# Patient Record
Sex: Male | Born: 1950 | Race: White | Hispanic: No | Marital: Married | Smoking: Former smoker
Health system: Southern US, Community
[De-identification: ages and names within clinical notes are randomized; demographics above are authoritative.]

## PROBLEM LIST (undated history)

## (undated) DIAGNOSIS — E669 Obesity, unspecified: Secondary | ICD-10-CM

## (undated) DIAGNOSIS — I5020 Unspecified systolic (congestive) heart failure: Secondary | ICD-10-CM

## (undated) DIAGNOSIS — E785 Hyperlipidemia, unspecified: Secondary | ICD-10-CM

## (undated) DIAGNOSIS — I2510 Atherosclerotic heart disease of native coronary artery without angina pectoris: Secondary | ICD-10-CM

## (undated) DIAGNOSIS — I4729 Other ventricular tachycardia: Secondary | ICD-10-CM

## (undated) DIAGNOSIS — Z95810 Presence of automatic (implantable) cardiac defibrillator: Secondary | ICD-10-CM

## (undated) DIAGNOSIS — E1169 Type 2 diabetes mellitus with other specified complication: Secondary | ICD-10-CM

## (undated) DIAGNOSIS — I4891 Unspecified atrial fibrillation: Secondary | ICD-10-CM

## (undated) DIAGNOSIS — I472 Ventricular tachycardia: Secondary | ICD-10-CM

## (undated) HISTORY — PX: HERNIA REPAIR: SHX51

---

## 2002-08-20 DIAGNOSIS — Z95810 Presence of automatic (implantable) cardiac defibrillator: Secondary | ICD-10-CM

## 2002-08-20 HISTORY — DX: Presence of automatic (implantable) cardiac defibrillator: Z95.810

## 2002-08-20 HISTORY — PX: CORONARY ARTERY BYPASS GRAFT: SHX141

## 2013-10-29 ENCOUNTER — Inpatient Hospital Stay (HOSPITAL_COMMUNITY)
Admission: EM | Admit: 2013-10-29 | Discharge: 2013-11-01 | DRG: 280 | Disposition: A | Discharge status: Home / Self Care | Payer: Non-veteran care | Attending: Internal Medicine | Admitting: Internal Medicine

## 2013-10-29 ENCOUNTER — Emergency Department (HOSPITAL_COMMUNITY): Payer: Non-veteran care

## 2013-10-29 ENCOUNTER — Encounter (HOSPITAL_COMMUNITY): Payer: Self-pay | Admitting: Emergency Medicine

## 2013-10-29 DIAGNOSIS — I472 Ventricular tachycardia, unspecified: Secondary | ICD-10-CM | POA: Diagnosis present

## 2013-10-29 DIAGNOSIS — E669 Obesity, unspecified: Secondary | ICD-10-CM | POA: Diagnosis present

## 2013-10-29 DIAGNOSIS — Z95810 Presence of automatic (implantable) cardiac defibrillator: Secondary | ICD-10-CM | POA: Diagnosis present

## 2013-10-29 DIAGNOSIS — I509 Heart failure, unspecified: Secondary | ICD-10-CM

## 2013-10-29 DIAGNOSIS — Z951 Presence of aortocoronary bypass graft: Secondary | ICD-10-CM

## 2013-10-29 DIAGNOSIS — D72829 Elevated white blood cell count, unspecified: Secondary | ICD-10-CM | POA: Diagnosis present

## 2013-10-29 DIAGNOSIS — E119 Type 2 diabetes mellitus without complications: Secondary | ICD-10-CM | POA: Diagnosis present

## 2013-10-29 DIAGNOSIS — I219 Acute myocardial infarction, unspecified: Secondary | ICD-10-CM | POA: Diagnosis present

## 2013-10-29 DIAGNOSIS — Z7982 Long term (current) use of aspirin: Secondary | ICD-10-CM

## 2013-10-29 DIAGNOSIS — I5023 Acute on chronic systolic (congestive) heart failure: Secondary | ICD-10-CM

## 2013-10-29 DIAGNOSIS — I2589 Other forms of chronic ischemic heart disease: Secondary | ICD-10-CM | POA: Diagnosis present

## 2013-10-29 DIAGNOSIS — E785 Hyperlipidemia, unspecified: Secondary | ICD-10-CM | POA: Diagnosis present

## 2013-10-29 DIAGNOSIS — I4891 Unspecified atrial fibrillation: Secondary | ICD-10-CM | POA: Diagnosis present

## 2013-10-29 DIAGNOSIS — Z4502 Encounter for adjustment and management of automatic implantable cardiac defibrillator: Secondary | ICD-10-CM

## 2013-10-29 DIAGNOSIS — I161 Hypertensive emergency: Secondary | ICD-10-CM

## 2013-10-29 DIAGNOSIS — J9600 Acute respiratory failure, unspecified whether with hypoxia or hypercapnia: Secondary | ICD-10-CM | POA: Diagnosis present

## 2013-10-29 DIAGNOSIS — I10 Essential (primary) hypertension: Secondary | ICD-10-CM | POA: Diagnosis present

## 2013-10-29 DIAGNOSIS — I214 Non-ST elevation (NSTEMI) myocardial infarction: Secondary | ICD-10-CM | POA: Diagnosis not present

## 2013-10-29 DIAGNOSIS — I255 Ischemic cardiomyopathy: Secondary | ICD-10-CM | POA: Diagnosis present

## 2013-10-29 DIAGNOSIS — J810 Acute pulmonary edema: Secondary | ICD-10-CM | POA: Diagnosis present

## 2013-10-29 DIAGNOSIS — I2510 Atherosclerotic heart disease of native coronary artery without angina pectoris: Secondary | ICD-10-CM | POA: Diagnosis present

## 2013-10-29 DIAGNOSIS — J9690 Respiratory failure, unspecified, unspecified whether with hypoxia or hypercapnia: Secondary | ICD-10-CM | POA: Diagnosis present

## 2013-10-29 DIAGNOSIS — Z7901 Long term (current) use of anticoagulants: Secondary | ICD-10-CM

## 2013-10-29 DIAGNOSIS — I480 Paroxysmal atrial fibrillation: Secondary | ICD-10-CM | POA: Diagnosis present

## 2013-10-29 HISTORY — DX: Other ventricular tachycardia: I47.29

## 2013-10-29 HISTORY — DX: Atherosclerotic heart disease of native coronary artery without angina pectoris: I25.10

## 2013-10-29 HISTORY — DX: Type 2 diabetes mellitus with other specified complication: E66.9

## 2013-10-29 HISTORY — DX: Hyperlipidemia, unspecified: E78.5

## 2013-10-29 HISTORY — DX: Presence of automatic (implantable) cardiac defibrillator: Z95.810

## 2013-10-29 HISTORY — DX: Type 2 diabetes mellitus with other specified complication: E11.69

## 2013-10-29 HISTORY — DX: Unspecified atrial fibrillation: I48.91

## 2013-10-29 HISTORY — DX: Ventricular tachycardia: I47.2

## 2013-10-29 HISTORY — DX: Unspecified systolic (congestive) heart failure: I50.20

## 2013-10-29 LAB — BASIC METABOLIC PANEL
BUN: 19 mg/dL (ref 6–23)
CHLORIDE: 99 meq/L (ref 96–112)
CO2: 22 mEq/L (ref 19–32)
Calcium: 9.2 mg/dL (ref 8.4–10.5)
Creatinine, Ser: 1.11 mg/dL (ref 0.50–1.35)
GFR calc Af Amer: 80 mL/min — ABNORMAL LOW (ref >90)
GFR calc non Af Amer: 69 mL/min — ABNORMAL LOW (ref >90)
GLUCOSE: 243 mg/dL — AB (ref 70–99)
Potassium: 4.3 mEq/L (ref 3.7–5.3)
Sodium: 140 mEq/L (ref 137–147)

## 2013-10-29 LAB — PROTIME-INR
INR: 2.85 — ABNORMAL HIGH (ref 0.00–1.49)
PROTHROMBIN TIME: 28.9 s — AB (ref 11.6–15.2)

## 2013-10-29 LAB — DIFFERENTIAL
BASOS ABS: 0 10*3/uL (ref 0.0–0.1)
Basophils Relative: 0 % (ref 0–1)
Eosinophils Absolute: 0.5 10*3/uL (ref 0.0–0.7)
Eosinophils Relative: 3 % (ref 0–5)
Lymphocytes Relative: 46 % (ref 12–46)
Lymphs Abs: 7.3 10*3/uL — ABNORMAL HIGH (ref 0.7–4.0)
MONOS PCT: 5 % (ref 3–12)
Monocytes Absolute: 0.8 10*3/uL (ref 0.1–1.0)
NEUTROS PCT: 46 % (ref 43–77)
Neutro Abs: 7.3 10*3/uL (ref 1.7–7.7)

## 2013-10-29 LAB — APTT: aPTT: 35 seconds (ref 24–37)

## 2013-10-29 LAB — CBC
HCT: 44.7 % (ref 39.0–52.0)
Hemoglobin: 15.8 g/dL (ref 13.0–17.0)
MCH: 35.1 pg — ABNORMAL HIGH (ref 26.0–34.0)
MCHC: 35.3 g/dL (ref 30.0–36.0)
MCV: 99.3 fL (ref 78.0–100.0)
PLATELETS: 220 10*3/uL (ref 150–400)
RBC: 4.5 MIL/uL (ref 4.22–5.81)
RDW: 15 % (ref 11.5–15.5)
WBC: 15.9 10*3/uL — AB (ref 4.0–10.5)

## 2013-10-29 LAB — PRO B NATRIURETIC PEPTIDE: Pro B Natriuretic peptide (BNP): 1167 pg/mL — ABNORMAL HIGH (ref 0–125)

## 2013-10-29 LAB — I-STAT TROPONIN, ED: Troponin i, poc: 0.3 ng/mL (ref 0.00–0.08)

## 2013-10-29 MED ORDER — ASPIRIN 81 MG PO CHEW: 324.0000 mg | CHEWABLE_TABLET | Freq: Once | ORAL | Status: DC

## 2013-10-29 MED ORDER — ASPIRIN 81 MG PO CHEW
CHEWABLE_TABLET | ORAL | Status: AC
  Filled 2013-10-29: qty 1

## 2013-10-29 MED ORDER — INSULIN ASPART 100 UNIT/ML ~~LOC~~ SOLN
0.0000 [IU] | Freq: Every day | SUBCUTANEOUS | Status: DC
Start: 2013-10-29 — End: 2013-11-01

## 2013-10-29 MED ORDER — METOPROLOL SUCCINATE ER 50 MG PO TB24
50.0000 mg | ORAL_TABLET | Freq: Two times a day (BID) | ORAL | Status: DC
  Filled 2013-10-29 (×3): qty 1

## 2013-10-29 MED ORDER — INSULIN ASPART 100 UNIT/ML ~~LOC~~ SOLN
0.0000 [IU] | Freq: Three times a day (TID) | SUBCUTANEOUS | Status: DC
  Administered 2013-10-30: 5 [IU] via SUBCUTANEOUS
  Administered 2013-10-30 – 2013-10-31 (×3): 2 [IU] via SUBCUTANEOUS
  Administered 2013-10-31: 3 [IU] via SUBCUTANEOUS
  Administered 2013-11-01: 2 [IU] via SUBCUTANEOUS

## 2013-10-29 MED ORDER — SODIUM CHLORIDE 0.9 % IJ SOLN
3.0000 mL | INTRAMUSCULAR | Status: DC | PRN
  Administered 2013-10-30: 3 mL via INTRAVENOUS

## 2013-10-29 MED ORDER — MORPHINE SULFATE 2 MG/ML IJ SOLN
INTRAMUSCULAR | Status: AC
  Administered 2013-10-29: 4 mg via INTRAVENOUS
  Filled 2013-10-29: qty 1

## 2013-10-29 MED ORDER — SODIUM CHLORIDE 0.9 % IV SOLN
Freq: Once | INTRAVENOUS | Status: AC
  Administered 2013-10-29: 22:00:00 via INTRAVENOUS

## 2013-10-29 MED ORDER — ASPIRIN 300 MG RE SUPP
300.0000 mg | Freq: Once | RECTAL | Status: AC
  Administered 2013-10-29: 300 mg via RECTAL
  Filled 2013-10-29: qty 1

## 2013-10-29 MED ORDER — ATORVASTATIN CALCIUM 40 MG PO TABS
40.0000 mg | ORAL_TABLET | Freq: Every day | ORAL | Status: DC
  Administered 2013-10-30 – 2013-10-31 (×2): 40 mg via ORAL
  Filled 2013-10-29 (×3): qty 1

## 2013-10-29 MED ORDER — ASPIRIN EC 81 MG PO TBEC
81.0000 mg | DELAYED_RELEASE_TABLET | Freq: Every day | ORAL | Status: DC
  Administered 2013-10-30 – 2013-11-01 (×3): 81 mg via ORAL
  Filled 2013-10-29 (×3): qty 1

## 2013-10-29 MED ORDER — NITROGLYCERIN IN D5W 200-5 MCG/ML-% IV SOLN
INTRAVENOUS | Status: AC
  Filled 2013-10-29: qty 250

## 2013-10-29 MED ORDER — MORPHINE SULFATE 4 MG/ML IJ SOLN: 4.0000 mg | Freq: Once | INTRAMUSCULAR | Status: AC

## 2013-10-29 MED ORDER — FUROSEMIDE 10 MG/ML IJ SOLN
40.0000 mg | Freq: Once | INTRAMUSCULAR | Status: AC
  Administered 2013-10-29: 40 mg via INTRAVENOUS
  Filled 2013-10-29: qty 4

## 2013-10-29 MED ORDER — LOSARTAN POTASSIUM 25 MG PO TABS
25.0000 mg | ORAL_TABLET | Freq: Every day | ORAL | Status: DC
  Filled 2013-10-29: qty 1

## 2013-10-29 MED ORDER — MORPHINE SULFATE 2 MG/ML IJ SOLN
INTRAMUSCULAR | Status: AC
  Filled 2013-10-29: qty 1

## 2013-10-29 MED ORDER — FUROSEMIDE 10 MG/ML IJ SOLN: 40.0000 mg | Freq: Once | INTRAMUSCULAR | Status: DC

## 2013-10-29 MED ORDER — NITROGLYCERIN IN D5W 200-5 MCG/ML-% IV SOLN
2.0000 ug/min | Freq: Once | INTRAVENOUS | Status: AC
  Administered 2013-10-29: 50 ug/min via INTRAVENOUS

## 2013-10-29 MED ORDER — SPIRONOLACTONE 12.5 MG HALF TABLET
12.5000 mg | ORAL_TABLET | Freq: Every day | ORAL | Status: DC
  Filled 2013-10-29: qty 1

## 2013-10-29 MED ORDER — SODIUM CHLORIDE 0.9 % IV SOLN: 250.0000 mL | INTRAVENOUS | Status: DC | PRN

## 2013-10-29 MED ORDER — SODIUM CHLORIDE 0.9 % IJ SOLN
3.0000 mL | Freq: Two times a day (BID) | INTRAMUSCULAR | Status: DC
  Administered 2013-10-30 – 2013-11-01 (×6): 3 mL via INTRAVENOUS

## 2013-10-29 NOTE — H&P (Addendum)
Primary cardiologist:  Clarita LeberErol Lale at [facility] in [place], [place]  PCP:   No PCP Per Patient   Chief Complaint:   Defibrillator shock and shortness of breath  HPI: Is a 63 year old Caucasian male with past medical history of coronary artery disease status post CABG x5 in 2004 as well as TI with one stenting 2008, systolic congestive heart failure with LVEF of 25% per patient, status post ICD who presented with severe her hypoxic respiratory failure. Patient reported that he was watching [employer] basketball game when he suddenly felt short of breath and 30 seconds later he experienced ICD shock x1. He denied loss of consciousness. He continued to have severe shortness of breath and finally after about one hour called EMS. EMS was found him with SpO2 of 80% on room manner place him on BiPAP and called a cold STEMI because of the left bundle branch block on the electrocardiogram. On arrival to the ED patient denied chest pain however was in severe respiratory distress and severe respiratory failure requiring noninvasive positive pressure ventilation. He was given 4 mg of morphine, started on nitroglycerin drip and continued on BiPAP with significant prominence of the into shortness of breath.  Patient was discussed with Dr. Tresa EndoKelly from interventional cardiology and we decided to cancel: STEMI.  Patient reported that possible triggers for his heart failure exacerbation could be dietary indiscretion, as well as emotional stress while watching basketball.     Review of Systems:  The patient denies anorexia, fever, weight loss,, vision loss, decreased hearing, hoarseness, balance deficits, hemoptysis, abdominal pain, melena, hematochezia, severe indigestion/heartburn, hematuria, incontinence, genital sores, muscle weakness, suspicious skin lesions, transient blindness, difficulty walking, depression, unusual weight change, abnormal bleeding, enlarged lymph nodes, angioedema, and breast  masses.  Past Medical History:  Past Surgical History  Procedure Laterality Date  . Coronary artery bypass graft  2004    5 grafts ( performed at [facility] in [place])  . Hernia repair     Past Medical History  Diagnosis Date  . A-fib   . CAD (coronary artery disease)     s/ p CABG x 5, 1 stent in 2008.  Marland Kitchen. HLD (hyperlipidemia)   . Diabetes mellitus type 2 in obese   . Systolic CHF     Last LVEF 25% per patient  . ICD (implantable cardioverter-defibrillator) in place 2004    St. Jude  . Ventricular tachycardia (paroxysmal)     hx of ICD shocks x 4 per patient     Medications: Prior to Admission medications   Medication Sig Start Date End Date Taking? Authorizing Provider  aspirin 81 MG tablet Take 81 mg by mouth daily.   Yes Historical Provider, MD  losartan (COZAAR) 25 MG tablet Take 25 mg by mouth daily.   Yes Historical Provider, MD  metoprolol succinate (TOPROL-XL) 50 MG 24 hr tablet Take 50 mg by mouth 2 (two) times daily. Take with or immediately following a meal.   Yes Historical Provider, MD  niacin (NIASPAN) 1000 MG CR tablet Take 2,000 mg by mouth at bedtime.   Yes Historical Provider, MD  omega-3 acid ethyl esters (LOVAZA) 1 G capsule Take 2 g by mouth daily.   Yes Historical Provider, MD  pravastatin (PRAVACHOL) 80 MG tablet Take 80 mg by mouth daily.   Yes Historical Provider, MD  spironolactone (ALDACTONE) 25 MG tablet Take 12.5 mg by mouth daily.   Yes Historical Provider, MD  warfarin (COUMADIN) 5 MG tablet Take  5 mg by mouth daily.   Yes Historical Provider, MD   Trilipix 135 mg daily Vitamin daily   Allergies:   Allergies  Allergen Reactions  . Captopril   . Heparin     Patient reported. Unknown reaction  . Ibuprofen   . Latex    Social History:  reports that he quit smoking about 14 months ago. His smoking use included Cigarettes. He has a 20 pack-year smoking history. He does not have any smokeless tobacco history on file.  He reports that he drinks about 1.0 ounces of alcohol per week. He reports that he does not use illicit drugs. The patient is normally at baseline able to climb one flight of stairs with minimal dyspnea  Family History: Family History  Problem Relation Age of Onset  . Heart attack Father 62    PHYSICAL EXAM:  Filed Vitals:   10/29/13 2219 10/29/13 2224 10/29/13 2230 10/29/13 2245  BP: 149/117  113/74 91/72  Pulse:   102 96  Resp: 25  18 25   Height:  6\' 1"  (1.854 m)    Weight:  104.781 kg (231 lb)    SpO2: 99%  100% 99%   General:  Well appearing. No respiratory difficulty HEENT: normal Neck: supple. JVP at 14 cm. Carotids 2+ bilat; no bruits. No lymphadenopathy or thryomegaly appreciated. Cor: PMI nondisplaced. Tachcardic, Regular rate. No rubs, gallops or murmurs. Lungs: diffuse bilateral rhonchi Abdomen: soft, nontender, nondistended. No hepatosplenomegaly. No bruits or masses. Good bowel sounds. Extremities: no cyanosis, clubbing, rash, 1+ pitting edema. Neuro: alert & oriented x 3, cranial nerves grossly intact. moves all 4 extremities w/o difficulty. Affect pleasant.  Labs on Admission:   Recent Labs  10/29/13 2215  NA 140  K 4.3  CL 99  CO2 22  GLUCOSE 243*  BUN 19  CREATININE 1.11  CALCIUM 9.2   No results found for this basename: AST, ALT, ALKPHOS, BILITOT, PROT, ALBUMIN,  in the last 72 hours No results found for this basename: LIPASE, AMYLASE,  in the last 72 hours  Recent Labs  10/29/13 2215  WBC 15.9*  NEUTROABS 7.3  HGB 15.8  HCT 44.7  MCV 99.3  PLT 220   No results found for this basename: CKTOTAL, CKMB, CKMBINDEX, TROPONINI,  in the last 72 hours No results found for this basename: TSH, T4TOTAL, FREET3, T3FREE, THYROIDAB,  in the last 72 hours No results found for this basename: VITAMINB12, FOLATE, FERRITIN, TIBC, IRON, RETICCTPCT,  in the last 72 hours  Radiological Exams on Admission (all images were personally reviewed and interpreted by  me, radiology reports were reviewed as well): Dg Chest Port 1 View  10/29/2013   CLINICAL DATA:  Chest pain, shortness of breath, COPD.  EXAM: PORTABLE CHEST - 1 VIEW  COMPARISON:  None.  FINDINGS: Cardiac contour upper normal to mildly enlarged. Mild central vascular congestion. Mediastinal contours otherwise within normal range. Interstitial prominence and mild perihilar hazy airspace opacity. Mild lung base opacities. Small effusions not excluded. No pneumothorax. Left chest wall battery pack with lead tips projecting over the expected position of the right atrium and right ventricle. Multiple other wires are presumably external. Status post median sternotomy. No acute osseous finding.  IMPRESSION: Heart size upper normal to mildly enlarged. Central vascular congestion. Interstitial prominence and hazy right greater than left perihilar airspace opacity may reflect pulmonary edema or pneumonia.  Mild lung base opacities, favor atelectasis.   Electronically Signed   By: Jearld Lesch M.D.   On:  10/29/2013 23:21    ECHO: note available EKG personally reviewed and interpreted by me: Sinus Tach 140 bpm, LBBB  Assessment:  Acute respiratory failure Acute pulmonary edema Acute chronic congestive heart failure and ICD shock Leukocytosis History atrial fibrillation on warfarin diabetes mellitus type II CAD status status post CABG  Problems/plan:  Acute respiratory failure / Acute pulmonary edema /Acute chronic congestive heart failure and Precipitating factor is not entirely clear. Patient reported dietary discretion and increased level of emotional stress secondary to watching basketball. However he also got ICD shock so ventricular tachycardia or maybe atrial fibrillation paroxysm could have contributed N.p.o. except for medications Troponin x2 Echocardiogram in the morning Continue home medications: Losartan 25 mg daily, metoprolol 50 mg twice a day, spironolactone 12.5 mg daily We'll give  Lasix 40 mg IV x1 of note patient was not on home Lasix Continue with positive noninvasive pressure ventilation Nitroglycerin drip   ICD shock Patient has a [employer]. Jude's ICD. Reported appropriate ICD shocks in the past Will need to Interrogate in the morning  Coronary artery disease status post CABG No chest pain on admission, STEMI was canceled, we'll cycle troponins, continue aspirin, statin, beta, blocker  Leukocytosis Blood cultures x2, no signs of infection. Will monitor  History atrial fibrillation on warfarin We'll hold warfarin for now (case patient needs a procedure). 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

## 2013-10-29 NOTE — ED Provider Notes (Signed)
CSN: [id number]     Arrival date & time 10/29/13  2159 History   First MD Initiated Contact with Patient 10/29/13 2200     Chief Complaint  Patient presents with  . Code STEMI    HPI Emmett Sw[place]and is a 63 y.o. male with a history of CHF, MI, and COPD who presents in acute respiratory failure.  Onset of symptoms was about 2:30 PM.  His ICD discharged.  Shortly after that, he started having severe shortness of breath.  EMS reported that he had chest pain earlier today, but he denies any chest pain.  Shortness of breath is severe, worsening.  EMS was called.  On their arrival, sats 80%, respiratory distress, tachypnea, tripoding, increased WOB.  They gave 4 SL NTG with no signficant change.  EKG with strain; they were concerned for STEMI and activated code STEMI in the field.  BP was 170s systolic.  They initiated BiPAP.  He has a history of severe CHF s/p ICD pacer, MI s/p CABG, HTN, COPD.  On arrival to the ED, he is satting low 90s on BiPAP, in severe respiratory distress with accessory muscle use, speaking in single words, tripod position, diaphoretic, hypertensive to 170s systolic, HR 120s.   Past medical history pertinent for CHF with EF of 25%, CAD, MI, COPD, hypertension  History reviewed. No pertinent past surgical history. No family history on file. History  Substance Use Topics  . Smoking status: Not on file  . Smokeless tobacco: Not on file  . Alcohol Use: Not on file    Review of Systems  Unable to perform ROS: Acuity of condition  Constitutional: Positive for diaphoresis.  Respiratory: Positive for shortness of breath.       Allergies  Heparin  Home Medications  No current outpatient prescriptions on file. BP 112/71  Pulse 97  Resp 22  Ht 6\' 1"  (1.854 m)  Wt 231 lb (104.781 kg)  BMI 30.48 kg/m2  SpO2 99% Physical Exam  Nursing note and vitals reviewed. Constitutional: He is oriented to person, place, and time. He appears distressed.  HENT:  Head:  Normocephalic and atraumatic.  Eyes: Conjunctivae are normal. Pupils are equal, round, and reactive to light.  Neck: Neck supple. JVD present. No tracheal deviation present.  Cardiovascular: Intact distal pulses.  Tachycardia present.   No murmur heard. Pulmonary/Chest: Accessory muscle usage present. No stridor. Tachypnea noted. He is in respiratory distress. He has rales in the right middle field, the right lower field, the left middle field and the left lower field.  Abdominal: Soft. He exhibits no distension. There is no tenderness. There is no rebound and no guarding.  Musculoskeletal: He exhibits no edema and no tenderness.  Neurological: He is alert and oriented to person, place, and time. No cranial nerve deficit. He exhibits normal muscle tone. Coordination normal.  Skin: He is diaphoretic.  Cool, diaphoretic    ED Course  Procedures (including critical care time) Labs Review Labs Reviewed  CBC - Abnormal; Notable for the following:    WBC 15.9 (*)    MCH 35.1 (*)    All other components within normal limits  PROTIME-INR - Abnormal; Notable for the following:    Prothrombin Time 28.9 (*)    INR 2.85 (*)    All other components within normal limits  BASIC METABOLIC PANEL - Abnormal; Notable for the following:    Glucose, Bld 243 (*)    GFR calc non Af Amer 69 (*)    GFR calc  Af Amer 80 (*)    All other components within normal limits  PRO B NATRIURETIC PEPTIDE - Abnormal; Notable for the following:    Pro B Natriuretic peptide (BNP) 1167.0 (*)    All other components within normal limits  I-STAT TROPOININ, ED - Abnormal; Notable for the following:    Troponin i, poc 0.30 (*)    All other components within normal limits  DIFFERENTIAL  APTT  I-STAT CHEM 8, ED   Imaging Review No results found.   EKG Interpretation   Date/Time:  Thursday October 29 2013 22:04:14 EDT Ventricular Rate:  131 PR Interval:  90 QRS Duration: 169 QT Interval:  345 QTC Calculation:  509 R Axis:   -100 Text Interpretation:  Sinus or ectopic atrial tachycardia Multiple  premature complexes, vent  IVCD, consider atypical LBBB Confirmed by  Bebe ShaggyWICKLINE  MD, DONALD ([id number]) on 10/29/2013 10:18:03 PM      MDM   Park [place] is a 63 y.o. male with h/o MI and CHF here with flash pulmonary edema, hypoxic respiratory failure; His ICD fired earlier today, several hours ago, just before sx onset.  No chest pain.  Code STEMI called off by cardiology fellow, at bedside on arrival.  BP 170s systolic.  Tachycardia to 120.  Respiratory distress, severe.  Rales to upper lung fields.  JVD.  Remainder of exam unremarkable. sats improved to upper 90s when placed on BiPAP. Started NTG gtt. Significant improvement in BP. EKG with LBBB. Gave Morphine. Gave rectal ASA.  On BiPAP throughout ED course.  Reevaluated multiple times during ED course; on reevaluation, normotensive on NTG gtt.  Appears significantly improved with nearly normal WOB, sats 100% on BiPAP, sitting back in bed, relaxed, endorses improved dyspnea.  Gave 40 mg IV lasix.  Will be admitted to CCU for further management.   Final diagnoses:  Hypertensive emergency  Respiratory failure  Acute pulmonary edema      Toney SangJerrid Mishael Krysiak, MD 10/30/13 0830

## 2013-10-29 NOTE — ED Provider Notes (Signed)
Pt seen on arrival with resident Pt appears to be in acute pulmonary edema currently on bipap EKG shows likely LBBB D/w dr Tresa Endo, cardiology, will not activate cath labs Pt appears to be improving on bipap Will be admitted to cardiology service/ICU  Joya Gaskins, MD 10/29/13 2218

## 2013-10-29 NOTE — ED Notes (Signed)
Family walked back from lobby

## 2013-10-29 NOTE — ED Notes (Signed)
Pt's pacer went off at 230 pm. Patient began having substernal CP and SOB. Bilateral rales in all 4 lobes. RA sat 82%. Patient on CPAP at this time sats are 94%

## 2013-10-30 ENCOUNTER — Inpatient Hospital Stay (HOSPITAL_COMMUNITY): Payer: Non-veteran care

## 2013-10-30 ENCOUNTER — Encounter (HOSPITAL_COMMUNITY): Payer: Self-pay | Admitting: *Deleted

## 2013-10-30 DIAGNOSIS — Z7901 Long term (current) use of anticoagulants: Secondary | ICD-10-CM

## 2013-10-30 DIAGNOSIS — I4891 Unspecified atrial fibrillation: Secondary | ICD-10-CM

## 2013-10-30 DIAGNOSIS — E119 Type 2 diabetes mellitus without complications: Secondary | ICD-10-CM | POA: Diagnosis present

## 2013-10-30 DIAGNOSIS — J810 Acute pulmonary edema: Secondary | ICD-10-CM

## 2013-10-30 DIAGNOSIS — I059 Rheumatic mitral valve disease, unspecified: Secondary | ICD-10-CM

## 2013-10-30 DIAGNOSIS — I214 Non-ST elevation (NSTEMI) myocardial infarction: Secondary | ICD-10-CM | POA: Diagnosis not present

## 2013-10-30 DIAGNOSIS — Z95810 Presence of automatic (implantable) cardiac defibrillator: Secondary | ICD-10-CM | POA: Diagnosis present

## 2013-10-30 DIAGNOSIS — I472 Ventricular tachycardia, unspecified: Secondary | ICD-10-CM | POA: Diagnosis present

## 2013-10-30 DIAGNOSIS — I480 Paroxysmal atrial fibrillation: Secondary | ICD-10-CM | POA: Diagnosis present

## 2013-10-30 DIAGNOSIS — J9600 Acute respiratory failure, unspecified whether with hypoxia or hypercapnia: Secondary | ICD-10-CM

## 2013-10-30 DIAGNOSIS — I2589 Other forms of chronic ischemic heart disease: Secondary | ICD-10-CM

## 2013-10-30 DIAGNOSIS — I255 Ischemic cardiomyopathy: Secondary | ICD-10-CM | POA: Diagnosis present

## 2013-10-30 DIAGNOSIS — E785 Hyperlipidemia, unspecified: Secondary | ICD-10-CM | POA: Diagnosis present

## 2013-10-30 DIAGNOSIS — I2510 Atherosclerotic heart disease of native coronary artery without angina pectoris: Secondary | ICD-10-CM | POA: Diagnosis present

## 2013-10-30 LAB — BASIC METABOLIC PANEL
BUN: 21 mg/dL (ref 6–23)
CALCIUM: 8.9 mg/dL (ref 8.4–10.5)
CO2: 23 meq/L (ref 19–32)
Chloride: 104 mEq/L (ref 96–112)
Creatinine, Ser: 1.01 mg/dL (ref 0.50–1.35)
GFR calc Af Amer: 89 mL/min — ABNORMAL LOW (ref >90)
GFR calc non Af Amer: 77 mL/min — ABNORMAL LOW (ref >90)
Glucose, Bld: 139 mg/dL — ABNORMAL HIGH (ref 70–99)
POTASSIUM: 4.3 meq/L (ref 3.7–5.3)
SODIUM: 142 meq/L (ref 137–147)

## 2013-10-30 LAB — TROPONIN I
TROPONIN I: 1.13 ng/mL — AB (ref <0.30)
TROPONIN I: 1.06 ng/mL — AB (ref <0.30)

## 2013-10-30 LAB — URINALYSIS, ROUTINE W REFLEX MICROSCOPIC
BILIRUBIN URINE: NEGATIVE
Glucose, UA: NEGATIVE mg/dL
HGB URINE DIPSTICK: NEGATIVE
KETONES UR: NEGATIVE mg/dL
Leukocytes, UA: NEGATIVE
Nitrite: NEGATIVE
Protein, ur: NEGATIVE mg/dL
SPECIFIC GRAVITY, URINE: 1.022 (ref 1.005–1.030)
UROBILINOGEN UA: 1 mg/dL (ref 0.0–1.0)
pH: 6.5 (ref 5.0–8.0)

## 2013-10-30 LAB — CBC
HCT: 40.5 % (ref 39.0–52.0)
Hemoglobin: 14.2 g/dL (ref 13.0–17.0)
MCH: 34.6 pg — ABNORMAL HIGH (ref 26.0–34.0)
MCHC: 35.1 g/dL (ref 30.0–36.0)
MCV: 98.8 fL (ref 78.0–100.0)
Platelets: 196 K/uL (ref 150–400)
RBC: 4.1 MIL/uL — ABNORMAL LOW (ref 4.22–5.81)
RDW: 15.2 % (ref 11.5–15.5)
WBC: 12.5 K/uL — ABNORMAL HIGH (ref 4.0–10.5)

## 2013-10-30 LAB — GLUCOSE, CAPILLARY
GLUCOSE-CAPILLARY: 162 mg/dL — AB (ref 70–99)
Glucose-Capillary: 129 mg/dL — ABNORMAL HIGH (ref 70–99)
Glucose-Capillary: 143 mg/dL — ABNORMAL HIGH (ref 70–99)
Glucose-Capillary: 216 mg/dL — ABNORMAL HIGH (ref 70–99)
Glucose-Capillary: 118 mg/dL — ABNORMAL HIGH (ref 70–99)

## 2013-10-30 LAB — MAGNESIUM: MAGNESIUM: 1.5 mg/dL (ref 1.5–2.5)

## 2013-10-30 LAB — HEMOGLOBIN A1C
Hgb A1c MFr Bld: 6.5 % — ABNORMAL HIGH (ref <5.7)
Mean Plasma Glucose: 140 mg/dL — ABNORMAL HIGH (ref <117)

## 2013-10-30 LAB — MRSA PCR SCREENING: MRSA BY PCR: NEGATIVE

## 2013-10-30 LAB — PROTIME-INR
INR: 3.03 — AB (ref 0.00–1.49)
Prothrombin Time: 30.3 seconds — ABNORMAL HIGH (ref 11.6–15.2)

## 2013-10-30 LAB — PATHOLOGIST SMEAR REVIEW

## 2013-10-30 MED ORDER — WARFARIN SODIUM 2.5 MG PO TABS
2.5000 mg | ORAL_TABLET | Freq: Once | ORAL | Status: AC
  Administered 2013-10-30: 2.5 mg via ORAL
  Filled 2013-10-30: qty 1

## 2013-10-30 MED ORDER — WARFARIN - PHARMACIST DOSING INPATIENT: Freq: Every day | Status: DC

## 2013-10-30 MED ORDER — AMIODARONE HCL 200 MG PO TABS
200.0000 mg | ORAL_TABLET | Freq: Every day | ORAL | Status: DC
  Administered 2013-10-30 – 2013-11-01 (×3): 200 mg via ORAL
  Filled 2013-10-30 (×3): qty 1

## 2013-10-30 NOTE — Progress Notes (Signed)
Cards aware of positive troponin.

## 2013-10-30 NOTE — Progress Notes (Signed)
Pts primary cardiologists' office is closed.  I was able to speak with his primary EP Dr Maudry Diego at [facility]. Dr Maudry Diego recommends amiodarone 200mg  daily (without additional load) and follow-up with him at [facility].  I would not recommend cath at this time.  We will continue coumadain (goal INR 2-3). He can be discharged if CHF is better in the am.  He should call both his primary EP and primary cardiologist to arrange follow-up once he gets home on Monday.  Electrophysiology team to see as needed while here. Please call with questions.

## 2013-10-30 NOTE — Progress Notes (Signed)
ANTICOAGULATION CONSULT NOTE - Initial Consult  Pharmacy Consult for Warfarin Indication: atrial fibrillation  Allergies  Allergen Reactions  . Captopril   . Heparin     Patient reported. Unknown reaction. However patient reported that he was diagnosed with this after his CABG  . Ibuprofen   . Latex     Patient Measurements: Height: 6\' 1"  (185.4 cm) Weight: 230 lb 13.2 oz (104.7 kg) IBW/kg (Calculated) : 79.9  Vital Signs: Temp: 98.6 F (37 C) (03/13 1200) Temp src: Oral (03/13 1200) BP: 95/59 mmHg (03/13 1200) Pulse Rate: 89 (03/13 1200)  Labs:  Recent Labs  10/29/13 2215 10/30/13 0418 10/30/13 0610 10/30/13 0855 10/30/13 1042  HGB 15.8  --   --   --  14.2  HCT 44.7  --   --   --  40.5  PLT 220  --   --   --  196  APTT 35  --   --   --   --   LABPROT 28.9*  --   --   --  30.3*  INR 2.85*  --   --   --  3.03*  CREATININE 1.11  --  1.01  --   --   TROPONINI  --  1.13*  --  1.06*  --     Estimated Creatinine Clearance: 95.1 ml/min (by C-G formula based on Cr of 1.01).   Medical History: Past Medical History  Diagnosis Date  . A-fib   . CAD (coronary artery disease)     s/ p CABG x 5, 1 stent in 2008.  Marland Kitchen HLD (hyperlipidemia)   . Diabetes mellitus type 2 in obese   . Systolic CHF     Last LVEF 25% per patient  . ICD (implantable cardioverter-defibrillator) in place 2004    St. Jude  . Ventricular tachycardia (paroxysmal)     hx of ICD shocks x 4 per patient    Medications:  Scheduled:  . amiodarone  200 mg Oral Daily  . aspirin EC  81 mg Oral Daily  . atorvastatin  40 mg Oral q1800  . furosemide  40 mg Intravenous Once  . insulin aspart  0-15 Units Subcutaneous TID WC  . insulin aspart  0-5 Units Subcutaneous QHS  . sodium chloride  3 mL Intravenous Q12H  . warfarin  2.5 mg Oral ONCE-1800  . Warfarin - Pharmacist Dosing Inpatient   Does not apply q1800    Assessment: 63 yo male admitted on 3/12 with CC of defibrillator shock and SOB. Pharmacy  has been consulted to resume PTA warfarin for afib. INR on admit on 3/12 was 2.85, and INR this AM was 3/13. Patient reports his last dose of warfarin was on 3/12, and he has not missed any doses in the last week. Patient is being started on amiodarone which may increase the concentration of warfarin. Warfarin will be dosed at half the normal PTA dose. Hg 14.2, platelets 196. No s/s of bleeding noted.   Warfarin PTA dose: 5mg  daily  Goal of Therapy:  INR 2-3 Monitor platelets by anticoagulation protocol: Yes   Plan:  - Warfarin 2.5mg  x 1 - Monitor daily CBC, INR, and s/s bleeding  Christiane Ha A. Lenon Ahmadi, PharmD Clinical Pharmacist - Resident Pager: [phone] Pharmacy: [phone] 10/30/2013 4:33 PM

## 2013-10-30 NOTE — Progress Notes (Signed)
Utilization review completed. Tylen Leverich, RN, BSN. 

## 2013-10-30 NOTE — Consult Note (Signed)
ELECTROPHYSIOLOGY CONSULT NOTE    Patient ID: Jose Sanford MRN: [id number], DOB/AGE: [birth year] 63 y.o.  Admit date: 10/29/2013 Date of Consult: 10-30-2013  Primary Physician: No PCP Per Patient Primary Cardiologist: Clarita LeberErol Lale at [facility] in [place], [place]  Reason for Consultation: ICD shock  HPI:  Jose Sanford is a 63 y.o. male with a past medical history of coronary disease s/p CABG, ischemic cardiomyopathy s/p dual chamber ICD implant, systolic heart failure, diabetes, and atrial fibrillation (on Warfarin).   [place] [employer] [place] and was at the games yesterday when he developed palpitations with subsequent ICD shock.  He continued to have severe shortness of breath and called EMS.  On EMS arrival, he was found to have a room air sat of 80% and he was transferred to [facility] for further evaluation.  He was placed on BiPap and NTG gtt with improvement in respiratory status.   Lab work is notable for troponin of 1.13, WBC 15.9, INR 2.85.  Echo ordered and is pending.   Prior to yesterday, he denies chest pain, shortness of breath, palpitations, dizziness or syncope.   EP has been asked to evaluate for device optimization.   Past Medical History  Diagnosis Date  . A-fib   . CAD (coronary artery disease)     s/ p CABG x 5, 1 stent in 2008.  Marland Kitchen. HLD (hyperlipidemia)   . Diabetes mellitus type 2 in obese   . Systolic CHF     Last LVEF 25% per patient  . ICD (implantable cardioverter-defibrillator) in place 2004    St. Jude  . Ventricular tachycardia (paroxysmal)     hx of ICD shocks x 4 per patient     Surgical History:  Past Surgical History  Procedure Laterality Date  . Coronary artery bypass graft  2004    5 grafts ( [facility] in [place])  . Hernia repair       Prescriptions prior to admission  Medication Sig Dispense Refill  . aspirin 81 MG tablet Take 81 mg by mouth daily.      Marland Kitchen. losartan (COZAAR) 25 MG  tablet Take 25 mg by mouth daily.      . metoprolol succinate (TOPROL-XL) 50 MG 24 hr tablet Take 50 mg by mouth 2 (two) times daily. Take with or immediately following a meal.      . niacin (NIASPAN) 1000 MG CR tablet Take 2,000 mg by mouth at bedtime.      Marland Kitchen. omega-3 acid ethyl esters (LOVAZA) 1 G capsule Take 2 g by mouth daily.      . pravastatin (PRAVACHOL) 80 MG tablet Take 80 mg by mouth daily.      Marland Kitchen. spironolactone (ALDACTONE) 25 MG tablet Take 12.5 mg by mouth daily.      Marland Kitchen. warfarin (COUMADIN) 5 MG tablet Take 5 mg by mouth daily.        Inpatient Medications:  . aspirin EC  81 mg Oral Daily  . atorvastatin  40 mg Oral q1800  . furosemide  40 mg Intravenous Once  . insulin aspart  0-15 Units Subcutaneous TID WC  . insulin aspart  0-5 Units Subcutaneous QHS  . morphine      . sodium chloride  3 mL Intravenous Q12H    Allergies:  Allergies  Allergen Reactions  . Captopril   . Heparin     Patient reported. Unknown reaction. However patient reported that he was diagnosed  with this after his CABG  . Ibuprofen   . Latex     History   Social History  . Marital Status: Married    Spouse Name: N/A    Number of Children: N/A  . Years of Education: N/A   Occupational History  . retired     used to work as a Production designer, theatre/television/film in [employer]   Social History Main Topics  . Smoking status: Former Smoker -- 1.00 packs/day for 20 years    Types: Cigarettes    Quit date: 08/20/1990  . Smokeless tobacco: Not on file  . Alcohol Use: 1.0 oz/week    2 drink(s) per week  . Drug Use: No  . Sexual Activity: Not on file   Other Topics Concern  . Not on file   Social History Narrative  . No narrative on file     Family History  Problem Relation Age of Onset  . Heart attack Father 42    Physical Exam: Filed Vitals:   10/30/13 0600 10/30/13 0700 10/30/13 0735 10/30/13 0800  BP: 86/61 82/63 86/61  87/70  Pulse: 79 82 84 85  Temp:   98.4 F (36.9 C)   TempSrc:   Oral   Resp: 16 17 23 20     Height:      Weight:      SpO2: 99% 99% 99% 98%    GEN- The patient is well appearing, alert and oriented x 3 today.   Head- normocephalic, atraumatic Eyes-  Sclera clear, conjunctiva pink Ears- hearing intact Oropharynx- clear Neck- supple, + JVD Lungs- Clear to ausculation bilaterally, normal work of breathing Heart- Regular rate and rhythm, no murmurs, rubs or gallops, PMI not laterally displaced GI- soft, NT, ND, + BS Extremities- no clubbing, cyanosis, or edema MS- no significant deformity or atrophy Skin- ICD pocket is well healed Psych- euthymic mood, full affect Neuro- strength and sensation are intact  Labs:   Lab Results  Component Value Date   WBC 15.9* 10/29/2013   HGB 15.8 10/29/2013   HCT 44.7 10/29/2013   MCV 99.3 10/29/2013   PLT 220 10/29/2013    Recent Labs Lab 10/30/13 0610  NA 142  K 4.3  CL 104  CO2 23  BUN 21  CREATININE 1.01  CALCIUM 8.9  GLUCOSE 139*   Lab Results  Component Value Date   TROPONINI 1.13* 10/30/2013     Radiology/Studies: Dg Chest Port 1 View 10/29/2013   CLINICAL DATA:  Chest pain, shortness of breath, COPD.  EXAM: PORTABLE CHEST - 1 VIEW  COMPARISON:  None.  FINDINGS: Cardiac contour upper normal to mildly enlarged. Mild central vascular congestion. Mediastinal contours otherwise within normal range. Interstitial prominence and mild perihilar hazy airspace opacity. Mild lung base opacities. Small effusions not excluded. No pneumothorax. Left chest wall battery pack with lead tips projecting over the expected position of the right atrium and right ventricle. Multiple other wires are presumably external. Status post median sternotomy. No acute osseous finding.  IMPRESSION: Heart size upper normal to mildly enlarged. Central vascular congestion. Interstitial prominence and hazy right greater than left perihilar airspace opacity may reflect pulmonary edema or pneumonia.  Mild lung base opacities, favor atelectasis.   Electronically Signed    By: Jearld Lesch M.D.   On: 10/29/2013 23:21   WUJ:WJXBJ rhythm with IVCD  TELEMETRY: sinus rhythm  Device History: STJ dual chamber ICD implanted 2004 with subsequent generator change 2011  Assessment and Plan:  1. afib with RVR The patient was  admitted with symptomatic afib and RVR.  He received an ICD shock for V rates > 170 bpm.  This shock restored sinus rhythm and now he is resting comfortably.  INR is therapeutic.  Given conversion to sinus with ICD shock, our goal should be to keep his INR therapeutic if possible.  He is allergic to heparin. I will speak with his cardiologist in [facility] before making a recommendation for antiarrhythmic therapy  2. ICD shock He had ICD shock therapy delivered for afib with RVR.  This restored sinus rhythm. Device interrogated is reviewed by me in detail today.  I have reprogrammed his device to a single zone at 200 bpm to try to minimize inappropriate therapy.  3. Ischemic CM/ chronic systolic dysfunction Gentle diuresis as BP allows Elevated troponin is likely a demand event in the setting of afib with very fast ventricular rates and also complicated by ICD shock therapy. I am not certain that cath would be required presently.  It would be nice to avoid interruption of anticoagulation if possible.  Perhaps he could follow closely with his primary cardiologist.  I will discuss this with his cardiologist if I am able to reach him today.  (I have attempted to contact his office but presently cannot get an answer.)

## 2013-10-30 NOTE — Progress Notes (Signed)
Placed pt. On cpap. Pt. Is tolerating well. No issues at this time.

## 2013-10-30 NOTE — Progress Notes (Signed)
  Echocardiogram 2D Echocardiogram has been performed.  [place] 10/30/2013, 10:37 AM

## 2013-10-30 NOTE — Progress Notes (Addendum)
SUBJECTIVE:  Feeling better on BiPAP  OBJECTIVE:   Vitals:   Filed Vitals:   10/30/13 0600 10/30/13 0700 10/30/13 0735 10/30/13 0800  BP: 86/61 82/63 86/61  87/70  Pulse: 79 82 84 85  Temp:   98.4 F (36.9 C)   TempSrc:   Oral   Resp: 16 17 23 20   Height:      Weight:      SpO2: 99% 99% 99% 98%   I&O's:   Intake/Output Summary (Last 24 hours) at 10/30/13 3976 Last data filed at 10/30/13 0800  Gross per 24 hour  Intake  25.75 ml  Output   1550 ml  Net -1524.25 ml   TELEMETRY: Reviewed telemetry pt in NSR:     PHYSICAL EXAM General: Well developed, well nourished, in no acute distress Head: Eyes PERRLA, No xanthomas.   Normal cephalic and atramatic  Lungs:   Crackles at right base and rhonchi anteriorly Heart:   HRRR S1 S2 Pulses are 2+ & equal. Abdomen: Bowel sounds are positive, abdomen soft and non-tender without masses  Extremities:   No clubbing, cyanosis or edema.  DP +1 Neuro: Alert and oriented X 3. Psych:  Good affect, responds appropriately   LABS: Basic Metabolic Panel:  Recent Labs  73/41/93 2215 10/30/13 0040 10/30/13 0610  NA 140  --  142  K 4.3  --  4.3  CL 99  --  104  CO2 22  --  23  GLUCOSE 243*  --  139*  BUN 19  --  21  CREATININE 1.11  --  1.01  CALCIUM 9.2  --  8.9  MG  --  1.5  --    Liver Function Tests: No results found for this basename: AST, ALT, ALKPHOS, BILITOT, PROT, ALBUMIN,  in the last 72 hours No results found for this basename: LIPASE, AMYLASE,  in the last 72 hours CBC:  Recent Labs  10/29/13 2215  WBC 15.9*  NEUTROABS 7.3  HGB 15.8  HCT 44.7  MCV 99.3  PLT 220   Cardiac Enzymes:  Recent Labs  10/30/13 0418  TROPONINI 1.13*   Coag Panel:   Lab Results  Component Value Date   INR 2.85* 10/29/2013    RADIOLOGY: Dg Chest Port 1 View  10/29/2013   CLINICAL DATA:  Chest pain, shortness of breath, COPD.  EXAM: PORTABLE CHEST - 1 VIEW  COMPARISON:  None.  FINDINGS: Cardiac contour upper normal to mildly  enlarged. Mild central vascular congestion. Mediastinal contours otherwise within normal range. Interstitial prominence and mild perihilar hazy airspace opacity. Mild lung base opacities. Small effusions not excluded. No pneumothorax. Left chest wall battery pack with lead tips projecting over the expected position of the right atrium and right ventricle. Multiple other wires are presumably external. Status post median sternotomy. No acute osseous finding.  IMPRESSION: Heart size upper normal to mildly enlarged. Central vascular congestion. Interstitial prominence and hazy right greater than left perihilar airspace opacity may reflect pulmonary edema or pneumonia.  Mild lung base opacities, favor atelectasis.   Electronically Signed   By: Jearld Lesch M.D.   On: 10/29/2013 23:21   Assessment:  Acute respiratory failure  Acute pulmonary edema  Acute on chronic congestive heart failure  ICD shock  Leukocytosis  History atrial fibrillation on warfarin  diabetes mellitus type II  CAD status status post CABG   Problems/plan:  Acute respiratory failure / Acute pulmonary edema /Acute chronic congestive heart failure  Precipitating factor is not entirely clear. Patient  reported dietary indiscretion and increased level of emotional stress secondary to watching basketball. However he also got ICD shock so ventricular tachycardia or maybe atrial fibrillation paroxysm could have contributed.  He has put out 1.5L since discharge.  I suspect he has had increased volume overload from dietary indiscretion leading to atrial stretch and PAF resulting in ICD shock.  Also need to consider flash pulmonary edema from transient ischemia and VT with shock.  He did notice palpitations right before the ICD shock. - Echocardiogram in the morning  - Hold Losartan/spironolactone/IV NTG gtt and beta blocker for now due to hypotension - Continue with positive noninvasive pressure ventilation  - continue ASA  ICD shock -  with palpitations prior to the event Patient has a St. Jude's ICD.  Reported appropriate ICD shocks in the past  Will need to Interrogate this am NSTEMI possibly secondary to demand ischemia secondary to acute CHF but need to consider acute ischemia in the setting of ICD shock.  Could have graft failure.  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 Will monitor  - recheck CBC this am - check UA - repeat chest xray this am due to findings yesterday of interstitial prominence and hazy right greater than left  perihilar airspace opacity which may reflect pulmonary edema or pneumonia. - CCM consult for possible PNA with sepsis (BP low and elevated WBC) History atrial fibrillation on warfarin  We'll hold warfarin for now (case patient needs a procedure). Patient is adequately anticoagulated  He is an allergic to heparin -( possible HIT) - start BiValirudin once INR<2 Diabetes mellitus type 2  Hold metformin while inpatient  In scale insulin without basal coverage for now      Quintella ReichertURNER,Dravyn Severs R, MD  10/30/2013  8:52 AM

## 2013-10-30 NOTE — Progress Notes (Signed)
Placed pt on cpap. Pt. Is tolerating well at this time. Pt. Requested nasal mask which is what pt. Wears at home. No issues at this time.

## 2013-10-30 NOTE — Discharge Instructions (Signed)
Information on my medicine - Coumadin®   (Warfarin) ° °This medication education was reviewed with me or my healthcare representative as part of my discharge preparation.  The pharmacist that spoke with me during my hospital stay was:  Geraldyne Barraclough A, RPH ° °Why was Coumadin prescribed for you? °Coumadin was prescribed for you because you have a blood clot or a medical condition that can cause an increased risk of forming blood clots. Blood clots can cause serious health problems by blocking the flow of blood to the heart, lung, or brain. Coumadin can prevent harmful blood clots from forming. °As a reminder your indication for Coumadin is:   Stroke Prevention Because Of Atrial Fibrillation ° °What test will check on my response to Coumadin? °While on Coumadin (warfarin) you will need to have an INR test regularly to ensure that your dose is keeping you in the desired range. The INR (international normalized ratio) number is calculated from the result of the laboratory test called prothrombin time (PT). ° °If an INR APPOINTMENT HAS NOT ALREADY BEEN MADE FOR YOU please schedule an appointment to have this lab work done by your health care provider within 7 days. °Your INR goal is usually a number between:  2 to 3 or your provider may give you a more narrow range like 2-2.5.  Ask your health care provider during an office visit what your goal INR is. ° °What  do you need to  know  About  COUMADIN? °Take Coumadin (warfarin) exactly as prescribed by your healthcare provider about the same time each day.  DO NOT stop taking without talking to the doctor who prescribed the medication.  Stopping without other blood clot prevention medication to take the place of Coumadin may increase your risk of developing a new clot or stroke.  Get refills before you run out. ° °What do you do if you miss a dose? °If you miss a dose, take it as soon as you remember on the same day then continue your regularly scheduled regimen the next  day.  Do not take two doses of Coumadin at the same time. ° °Important Safety Information °A possible side effect of Coumadin (Warfarin) is an increased risk of bleeding. You should call your healthcare provider right away if you experience any of the following: °  Bleeding from an injury or your nose that does not stop. °  Unusual colored urine (red or dark brown) or unusual colored stools (red or black). °  Unusual bruising for unknown reasons. °  A serious fall or if you hit your head (even if there is no bleeding). ° °Some foods or medicines interact with Coumadin® (warfarin) and might alter your response to warfarin. To help avoid this: °  Eat a balanced diet, maintaining a consistent amount of Vitamin K. °  Notify your provider about major diet changes you plan to make. °  Avoid alcohol or limit your intake to 1 drink for women and 2 drinks for men per day. °(1 drink is 5 oz. wine, 12 oz. beer, or 1.5 oz. liquor.) ° °Make sure that ANY health care provider who prescribes medication for you knows that you are taking Coumadin (warfarin).  Also make sure the healthcare provider who is monitoring your Coumadin knows when you have started a new medication including herbals and non-prescription products. ° °Coumadin® (Warfarin)  Major Drug Interactions  °Increased Warfarin Effect Decreased Warfarin Effect  °Alcohol (large quantities) °Antibiotics (esp. Septra/Bactrim, Flagyl, Cipro) °Amiodarone (Cordarone) °Aspirin (  ASA) °Cimetidine (Tagamet) °Megestrol (Megace) °NSAIDs (ibuprofen, naproxen, etc.) °Piroxicam (Feldene) °Propafenone (Rythmol SR) °Propranolol (Inderal) °Isoniazid (INH) °Posaconazole (Noxafil) Barbiturates (Phenobarbital) °Carbamazepine (Tegretol) °Chlordiazepoxide (Librium) °Cholestyramine (Questran) °Griseofulvin °Oral Contraceptives °Rifampin °Sucralfate (Carafate) °Vitamin K  ° °Coumadin® (Warfarin) Major Herbal Interactions  °Increased Warfarin Effect Decreased Warfarin Effect   °Garlic °Ginseng °Ginkgo biloba Coenzyme Q10 °Green tea °St. John’s wort   ° °Coumadin® (Warfarin) FOOD Interactions  °Eat a consistent number of servings per week of foods HIGH in Vitamin K °(1 serving = ½ cup)  °Collards (cooked, or boiled & drained) °Kale (cooked, or boiled & drained) °Mustard greens (cooked, or boiled & drained) °Parsley *serving size only = ¼ cup °Spinach (cooked, or boiled & drained) °Swiss chard (cooked, or boiled & drained) °Turnip greens (cooked, or boiled & drained)  °Eat a consistent number of servings per week of foods MEDIUM-HIGH in Vitamin K °(1 serving = 1 cup)  °Asparagus (cooked, or boiled & drained) °Broccoli (cooked, boiled & drained, or raw & chopped) °Brussel sprouts (cooked, or boiled & drained) *serving size only = ½ cup °Lettuce, raw (green leaf, endive, romaine) °Spinach, raw °Turnip greens, raw & chopped  ° °These websites have more information on Coumadin (warfarin):  www.coumadin.com; °www.ahrq.gov/consumer/coumadin.htm; ° ° °

## 2013-10-31 DIAGNOSIS — Z4502 Encounter for adjustment and management of automatic implantable cardiac defibrillator: Secondary | ICD-10-CM

## 2013-10-31 DIAGNOSIS — Z0389 Encounter for observation for other suspected diseases and conditions ruled out: Secondary | ICD-10-CM

## 2013-10-31 DIAGNOSIS — I509 Heart failure, unspecified: Secondary | ICD-10-CM

## 2013-10-31 DIAGNOSIS — I480 Paroxysmal atrial fibrillation: Secondary | ICD-10-CM | POA: Diagnosis present

## 2013-10-31 DIAGNOSIS — I5023 Acute on chronic systolic (congestive) heart failure: Principal | ICD-10-CM

## 2013-10-31 LAB — CBC
HEMATOCRIT: 33.3 % — AB (ref 39.0–52.0)
HEMOGLOBIN: 11.6 g/dL — AB (ref 13.0–17.0)
MCH: 34.5 pg — AB (ref 26.0–34.0)
MCHC: 34.8 g/dL (ref 30.0–36.0)
MCV: 99.1 fL (ref 78.0–100.0)
Platelets: 157 10*3/uL (ref 150–400)
RBC: 3.36 MIL/uL — ABNORMAL LOW (ref 4.22–5.81)
RDW: 15 % (ref 11.5–15.5)
WBC: 9.1 10*3/uL (ref 4.0–10.5)

## 2013-10-31 LAB — BASIC METABOLIC PANEL
BUN: 17 mg/dL (ref 6–23)
CO2: 22 meq/L (ref 19–32)
CREATININE: 0.93 mg/dL (ref 0.50–1.35)
Calcium: 8.7 mg/dL (ref 8.4–10.5)
Chloride: 103 mEq/L (ref 96–112)
GFR calc non Af Amer: 88 mL/min — ABNORMAL LOW (ref >90)
GLUCOSE: 105 mg/dL — AB (ref 70–99)
POTASSIUM: 3.9 meq/L (ref 3.7–5.3)
Sodium: 137 mEq/L (ref 137–147)

## 2013-10-31 LAB — GLUCOSE, CAPILLARY
GLUCOSE-CAPILLARY: 107 mg/dL — AB (ref 70–99)
GLUCOSE-CAPILLARY: 192 mg/dL — AB (ref 70–99)
GLUCOSE-CAPILLARY: 146 mg/dL — AB (ref 70–99)
GLUCOSE-CAPILLARY: 114 mg/dL — AB (ref 70–99)

## 2013-10-31 LAB — PROTIME-INR
INR: 2.87 — AB (ref 0.00–1.49)
Prothrombin Time: 29.1 seconds — ABNORMAL HIGH (ref 11.6–15.2)

## 2013-10-31 MED ORDER — METFORMIN HCL 500 MG PO TABS
250.0000 mg | ORAL_TABLET | Freq: Two times a day (BID) | ORAL | Status: DC
  Administered 2013-10-31 – 2013-11-01 (×2): 250 mg via ORAL
  Filled 2013-10-31 (×4): qty 1

## 2013-10-31 MED ORDER — WARFARIN SODIUM 5 MG PO TABS
5.0000 mg | ORAL_TABLET | Freq: Once | ORAL | Status: AC
  Administered 2013-10-31: 5 mg via ORAL
  Filled 2013-10-31: qty 1

## 2013-10-31 MED ORDER — FUROSEMIDE 20 MG PO TABS
20.0000 mg | ORAL_TABLET | Freq: Every day | ORAL | Status: DC
  Administered 2013-10-31 – 2013-11-01 (×2): 20 mg via ORAL
  Filled 2013-10-31 (×2): qty 1

## 2013-10-31 MED ORDER — FUROSEMIDE 40 MG PO TABS: 40.0000 mg | ORAL_TABLET | Freq: Every day | ORAL | Status: DC

## 2013-10-31 MED ORDER — METOPROLOL SUCCINATE ER 50 MG PO TB24
50.0000 mg | ORAL_TABLET | Freq: Two times a day (BID) | ORAL | Status: DC
  Filled 2013-10-31 (×2): qty 1

## 2013-10-31 MED ORDER — LOSARTAN POTASSIUM 25 MG PO TABS
25.0000 mg | ORAL_TABLET | Freq: Every day | ORAL | Status: DC
  Filled 2013-10-31: qty 1

## 2013-10-31 MED ORDER — METOPROLOL SUCCINATE 12.5 MG HALF TABLET
12.5000 mg | ORAL_TABLET | Freq: Every day | ORAL | Status: DC
  Administered 2013-10-31 – 2013-11-01 (×2): 12.5 mg via ORAL
  Filled 2013-10-31 (×2): qty 1

## 2013-10-31 MED ORDER — SPIRONOLACTONE 12.5 MG HALF TABLET
12.5000 mg | ORAL_TABLET | Freq: Every day | ORAL | Status: DC
  Administered 2013-10-31 – 2013-11-01 (×2): 12.5 mg via ORAL
  Filled 2013-10-31 (×2): qty 1

## 2013-10-31 NOTE — Progress Notes (Signed)
ANTICOAGULATION CONSULT NOTE - Follow-Up Consult  Pharmacy Consult for Warfarin Indication: atrial fibrillation  Allergies  Allergen Reactions  . Captopril   . Heparin     Patient reported. Unknown reaction. However patient reported that he was diagnosed with this after his CABG  . Ibuprofen   . Latex     Patient Measurements: Height: 6\' 1"  (185.4 cm) Weight: 231 lb 14.8 oz (105.2 kg) IBW/kg (Calculated) : 79.9  Vital Signs: Temp: 98.7 F (37.1 C) (03/14 0347) Temp src: Oral (03/14 0347) BP: 97/60 mmHg (03/14 0500) Pulse Rate: 91 (03/14 0500)  Labs:  Recent Labs  10/29/13 2215 10/30/13 0418 10/30/13 0610 10/30/13 0855 10/30/13 1042 10/31/13 0330  HGB 15.8  --   --   --  14.2 11.6*  HCT 44.7  --   --   --  40.5 33.3*  PLT 220  --   --   --  196 157  APTT 35  --   --   --   --   --   LABPROT 28.9*  --   --   --  30.3* 29.1*  INR 2.85*  --   --   --  3.03* 2.87*  CREATININE 1.11  --  1.01  --   --  0.93  TROPONINI  --  1.13*  --  1.06*  --   --     Estimated Creatinine Clearance: 103.5 ml/min (by C-G formula based on Cr of 0.93).   Medical History: Past Medical History  Diagnosis Date  . A-fib   . CAD (coronary artery disease)     s/ p CABG x 5, 1 stent in 2008.  Marland Kitchen HLD (hyperlipidemia)   . Diabetes mellitus type 2 in obese   . Systolic CHF     Last LVEF 25% per patient  . ICD (implantable cardioverter-defibrillator) in place 2004    St. Jude  . Ventricular tachycardia (paroxysmal)     hx of ICD shocks x 4 per patient    Medications:  Scheduled:  . amiodarone  200 mg Oral Daily  . aspirin EC  81 mg Oral Daily  . atorvastatin  40 mg Oral q1800  . furosemide  40 mg Intravenous Once  . insulin aspart  0-15 Units Subcutaneous TID WC  . insulin aspart  0-5 Units Subcutaneous QHS  . sodium chloride  3 mL Intravenous Q12H  . Warfarin - Pharmacist Dosing Inpatient   Does not apply q1800    Assessment: 63 yo male admitted on 3/12 with CC of  defibrillator shock and SOB. Pharmacy has been consulted to resume PTA warfarin for afib. INR on admit on 3/12 was 2.85, and INR on 3/13 was 3.03. Patient reports his last dose of warfarin was on 3/12, and he has not missed any doses in the last week. Patient is being started on amiodarone which may increase the concentration of warfarin. Today's INR decreased to 2.87. PTA warfarin dose will be resumed, but if the patient continues on amiodarone a dose reduction will likely be required. Hg 11.6 (down from 14.2), platelets 157. No s/s of bleeding per nurse.   Warfarin PTA dose: 5mg  daily  Goal of Therapy:  INR 2-3 Monitor platelets by anticoagulation protocol: Yes   Plan:  - Warfarin 5mg  x 1 - Monitor daily CBC, INR, and s/s bleeding  Christiane Ha A. Lenon Ahmadi, PharmD Clinical Pharmacist - Resident Pager: [phone] Pharmacy: [phone] 10/31/2013 7:33 AM

## 2013-10-31 NOTE — Progress Notes (Signed)
Report was called to RN on unit 3 [place]. Pt will transfer to room 3 [address] 1 with wife at bedside.

## 2013-10-31 NOTE — Progress Notes (Addendum)
SUBJECTIVE:  No complaints  OBJECTIVE:   Vitals:   Filed Vitals:   10/31/13 0600 10/31/13 0700 10/31/13 0746 10/31/13 0800  BP: 89/59 97/61 106/68   Pulse: 89 88 95   Temp:    98.1 F (36.7 C)  TempSrc:    Oral  Resp:   20   Height:      Weight:      SpO2: 96% 96% 96%    I&O's:   Intake/Output Summary (Last 24 hours) at 10/31/13 0835 Last data filed at 10/30/13 2300  Gross per 24 hour  Intake   1020 ml  Output    600 ml  Net    420 ml   TELEMETRY: Reviewed telemetry pt in NSR:     PHYSICAL EXAM General: Well developed, well nourished, in no acute distress Head: Eyes PERRLA, No xanthomas.   Normal cephalic and atramatic  Lungs:   Clear bilaterally to auscultation and percussion. Heart:   HRRR S1 S2 Pulses are 2+ & equal. Abdomen: Bowel sounds are positive, abdomen soft and non-tender without masses Extremities:   No clubbing, cyanosis or edema.  DP +1 Neuro: Alert and oriented X 3. Psych:  Good affect, responds appropriately   LABS: Basic Metabolic Panel:  Recent Labs  16/05/9602/12/15 2215 10/30/13 0040 10/30/13 0610 10/31/13 0330  NA 140  --  142 137  K 4.3  --  4.3 3.9  CL 99  --  104 103  CO2 22  --  23 22  GLUCOSE 243*  --  139* 105*  BUN 19  --  21 17  CREATININE 1.11  --  1.01 0.93  CALCIUM 9.2  --  8.9 8.7  MG  --  1.5  --   --    Liver Function Tests: No results found for this basename: AST, ALT, ALKPHOS, BILITOT, PROT, ALBUMIN,  in the last 72 hours No results found for this basename: LIPASE, AMYLASE,  in the last 72 hours CBC:  Recent Labs  10/29/13 2215 10/30/13 1042 10/31/13 0330  WBC 15.9* 12.5* 9.1  NEUTROABS 7.3  --   --   HGB 15.8 14.2 11.6*  HCT 44.7 40.5 33.3*  MCV 99.3 98.8 99.1  PLT 220 196 157   Cardiac Enzymes:  Recent Labs  10/30/13 0418 10/30/13 0855  TROPONINI 1.13* 1.06*   BNP: No components found with this basename: POCBNP,  D-Dimer: No results found for this basename: DDIMER,  in the last 72 hours Hemoglobin  A1C:  Recent Labs  10/30/13 0040  HGBA1C 6.5*   Fasting Lipid Panel: No results found for this basename: CHOL, HDL, LDLCALC, TRIG, CHOLHDL, LDLDIRECT,  in the last 72 hours Thyroid Function Tests: No results found for this basename: TSH, T4TOTAL, FREET3, T3FREE, THYROIDAB,  in the last 72 hours Anemia Panel: No results found for this basename: VITAMINB12, FOLATE, FERRITIN, TIBC, IRON, RETICCTPCT,  in the last 72 hours Coag Panel:   Lab Results  Component Value Date   INR 2.87* 10/31/2013   INR 3.03* 10/30/2013   INR 2.85* 10/29/2013    RADIOLOGY: Dg Chest 2 View  10/30/2013   CLINICAL DATA:  CHF, weakness.  EXAM: CHEST  2 VIEW  COMPARISON:  DG CHEST 1V PORT dated 10/29/2013  FINDINGS: Trachea is midline. Heart is enlarged. Pacemaker and ICD lead tips are seen in the right atrium and right ventricle, respectively. Biapical pleural thickening. Near complete resolution of previously seen pulmonary edema. Residual peripheral septal thickening at the lung bases. Small bilateral  effusions persist.  IMPRESSION: Near-complete resolution of pulmonary edema. Tiny bilateral pleural effusions.   Electronically Signed   By: Leanna Battles M.D.   On: 10/30/2013 10:08   Dg Chest Port 1 View  10/29/2013   CLINICAL DATA:  Chest pain, shortness of breath, COPD.  EXAM: PORTABLE CHEST - 1 VIEW  COMPARISON:  None.  FINDINGS: Cardiac contour upper normal to mildly enlarged. Mild central vascular congestion. Mediastinal contours otherwise within normal range. Interstitial prominence and mild perihilar hazy airspace opacity. Mild lung base opacities. Small effusions not excluded. No pneumothorax. Left chest wall battery pack with lead tips projecting over the expected position of the right atrium and right ventricle. Multiple other wires are presumably external. Status post median sternotomy. No acute osseous finding.  IMPRESSION: Heart size upper normal to mildly enlarged. Central vascular congestion. Interstitial  prominence and hazy right greater than left perihilar airspace opacity may reflect pulmonary edema or pneumonia.  Mild lung base opacities, favor atelectasis.   Electronically Signed   By: Jearld Lesch M.D.   On: 10/29/2013 23:21   Assessment:  Acute respiratory failure  Acute pulmonary edema  Acute on chronic congestive heart failure  ICD shock  Leukocytosis  History atrial fibrillation on warfarin  diabetes mellitus type II  CAD status status post CABG  Problems/plan:  Acute respiratory failure / Acute pulmonary edema /Acute chronic congestive heart failure  Precipitating factor most likely dietary indiscretion with volume overload and acute CHF exacerbate by afib.   Patient reported dietary indiscretion and increased level of emotional stress secondary to watching basketball. 2D echo with EF 30% and ? Clot at the apex.    - add Lasix 20mg  daily.  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  - coumadin restarted yesterday Coronary artery disease status post CABG  No chest pain on admission, NSTEMI type II from CHF and ICD shock -continue aspirin, statin, beta, blocker  - no further ischemic workup needed at this time Leukocytosis - resolved.  UA normal and no infiltrate on chest xray Paroxysmal atrial fibrillation on warfarin  - warfarin restarted with therapeutic INR - Amio low dose started yesterday.  Will continue Amio 200mg  daily and followup with his EP MD Diabetes mellitus type 2  Restart Metformin In scale insulin without basal coverage for now Dyslipidemia - restart pravastatin/Niacin  Doing well.  CHF appears  resolved.  No arrhythmias.  Tolerating Amio.  He has not been ambulating yet and was mildy SOB last PM.  Will transfer out to tele bed and ambulate today and if breathing ok in am will plan d.c tomorrow.    He will need to call both his primary EP and Cardiologist to arrange followup once he gets home.  He will need to see his Cardiologist Monday for INR check since we are adding Amio which will change his warfarin needs   Quintella Reichert, MD  10/31/2013  8:35 AM

## 2013-10-31 NOTE — ED Provider Notes (Signed)
I have personally seen and examined the patient.  I have discussed the plan of care with the resident.  I have reviewed the documentation on PMH/FH/Soc. History.  I have reviewed the documentation of the resident and agree.  CRITICAL CARE Performed by: Joya Gaskins Total critical care time: 40 Critical care time was exclusive of separately billable procedures and treating other patients. Critical care was necessary to treat or prevent imminent or life-threatening deterioration. Critical care was time spent personally by me on the following activities: development of treatment plan with patient and/or surrogate as well as nursing, discussions with consultants, evaluation of patient's response to treatment, examination of patient, obtaining history from patient or surrogate, ordering and performing treatments and interventions, ordering and review of laboratory studies, ordering and review of radiographic studies, pulse oximetry and re-evaluation of patient's condition.   Pt ill appearing on arrival, but improved with bipap and NTG drip.  Admitted to cardiology service.  Pt stabilized in the ED  Joya Gaskins, MD 10/31/13 1909

## 2013-10-31 NOTE — Progress Notes (Addendum)
Patient came to floor around 0930 am, alert and oriented, no c/o pain, ST with first degree AV block. Wife at bedside. Also ambulate patient around 11:00 am, patient c/o of lung pain and shortness of breath, O2 saturation sustain in 97%/RA, HR up to 130, metoprolol given per order, patient stated he felt better after resting . Patient ambulate in hallway x2 this eveninig no c/o  Pain or shortness of breath. Will continue to monitor patient.

## 2013-11-01 LAB — BASIC METABOLIC PANEL
BUN: 16 mg/dL (ref 6–23)
CALCIUM: 9 mg/dL (ref 8.4–10.5)
CO2: 25 mEq/L (ref 19–32)
CREATININE: 0.97 mg/dL (ref 0.50–1.35)
Chloride: 98 mEq/L (ref 96–112)
GFR calc Af Amer: >90 mL/min (ref >90)
GFR calc non Af Amer: 86 mL/min — ABNORMAL LOW (ref >90)
Glucose, Bld: 112 mg/dL — ABNORMAL HIGH (ref 70–99)
Potassium: 3.5 mEq/L — ABNORMAL LOW (ref 3.7–5.3)
SODIUM: 138 meq/L (ref 137–147)

## 2013-11-01 LAB — CBC
HCT: 34.7 % — ABNORMAL LOW (ref 39.0–52.0)
Hemoglobin: 12.2 g/dL — ABNORMAL LOW (ref 13.0–17.0)
MCH: 34.5 pg — ABNORMAL HIGH (ref 26.0–34.0)
MCHC: 35.2 g/dL (ref 30.0–36.0)
MCV: 98 fL (ref 78.0–100.0)
PLATELETS: 165 10*3/uL (ref 150–400)
RBC: 3.54 MIL/uL — AB (ref 4.22–5.81)
RDW: 14.9 % (ref 11.5–15.5)
WBC: 9.3 10*3/uL (ref 4.0–10.5)

## 2013-11-01 LAB — PROTIME-INR
INR: 2.54 — AB (ref 0.00–1.49)
Prothrombin Time: 26.5 seconds — ABNORMAL HIGH (ref 11.6–15.2)

## 2013-11-01 LAB — GLUCOSE, CAPILLARY
Glucose-Capillary: 107 mg/dL — ABNORMAL HIGH (ref 70–99)
Glucose-Capillary: 131 mg/dL — ABNORMAL HIGH (ref 70–99)

## 2013-11-01 MED ORDER — AMIODARONE HCL 200 MG PO TABS: 200.0000 mg | ORAL_TABLET | Freq: Every day | ORAL | Status: AC

## 2013-11-01 MED ORDER — WARFARIN SODIUM 5 MG PO TABS
5.0000 mg | ORAL_TABLET | Freq: Once | ORAL | Status: DC
  Filled 2013-11-01: qty 1

## 2013-11-01 MED ORDER — SPIRONOLACTONE 12.5 MG HALF TABLET: 12.5000 mg | ORAL_TABLET | Freq: Every day | ORAL | Status: AC

## 2013-11-01 MED ORDER — METOPROLOL SUCCINATE 12.5 MG HALF TABLET: 12.5000 mg | ORAL_TABLET | Freq: Every day | ORAL | Status: AC

## 2013-11-01 MED ORDER — FUROSEMIDE 20 MG PO TABS: 20.0000 mg | ORAL_TABLET | Freq: Every day | ORAL | Status: DC

## 2013-11-01 NOTE — Progress Notes (Signed)
Patient discharged to home with wife.  Patient instructed to schedule follow-up with cardiologist at home in [facility] tomorrow.  IV removed prior to discharge, IV site clean, dry, and intact.  Discharge instructions, education, and medications discussed with patient and wife prior to discharge.  Patient and wife voiced understanding of instructions.  Physical prescriptions provided to patient at time of discharge, along with discharge packet.

## 2013-11-01 NOTE — Progress Notes (Signed)
DAILY PROGRESS NOTE  Subjective:   No events overnight. No further ICD shocks. Amiodarone added. He diuresed 1L negative yesterday. Feels great. Wants to go home.  Objective:  Temp:  [98.3 F (36.8 C)-100.3 F (37.9 C)] 98.4 F (36.9 C) (03/15 0125) Pulse Rate:  [88-118] 98 (03/15 0944) Resp:  [18-20] 18 (03/15 0944) BP: (101-121)/(65-81) 112/79 mmHg (03/15 0944) SpO2:  [94 %-99 %] 98 % (03/15 0944) Weight:  [224 lb 4.8 oz (101.742 kg)] 224 lb 4.8 oz (101.742 kg) (03/15 0708) Weight change: -2 lb 3.3 oz (-1 kg)  Intake/Output from previous day: 03/14 0701 - 03/15 0700 In: 720 [P.O.:720] Out: 1775 [Urine:1775]  Intake/Output from this shift: Total I/O In: 483 [P.O.:480; I.V.:3] Out: 220 [Urine:220]  Medications: Current Facility-Administered Medications  Medication Dose Route Frequency Provider Last Rate Last Dose  . 0.9 %  sodium chloride infusion  250 mL Intravenous PRN Inez Pilgrim, MD      . amiodarone (PACERONE) tablet 200 mg  200 mg Oral Daily Thompson Grayer, MD   200 mg at 11/01/13 0945  . aspirin EC tablet 81 mg  81 mg Oral Daily Inez Pilgrim, MD   81 mg at 11/01/13 0945  . atorvastatin (LIPITOR) tablet 40 mg  40 mg Oral q1800 Inez Pilgrim, MD   40 mg at 10/31/13 1751  . furosemide (LASIX) tablet 20 mg  20 mg Oral Daily Sueanne Margarita, MD   20 mg at 11/01/13 0945  . insulin aspart (novoLOG) injection 0-15 Units  0-15 Units Subcutaneous TID WC Inez Pilgrim, MD   2 Units at 10/31/13 1752  . insulin aspart (novoLOG) injection 0-5 Units  0-5 Units Subcutaneous QHS Inez Pilgrim, MD      . metFORMIN (GLUCOPHAGE) tablet 250 mg  250 mg Oral BID WC Sueanne Margarita, MD   250 mg at 11/01/13 0757  . metoprolol succinate (TOPROL-XL) 24 hr tablet 12.5 mg  12.5 mg Oral Daily Sueanne Margarita, MD   12.5 mg at 11/01/13 0944  . sodium chloride 0.9 % injection 3 mL  3 mL Intravenous Q12H Inez Pilgrim, MD   3 mL at 11/01/13 0945  . sodium chloride 0.9 % injection 3 mL   3 mL Intravenous PRN Inez Pilgrim, MD   3 mL at 10/30/13 2102  . spironolactone (ALDACTONE) tablet 12.5 mg  12.5 mg Oral Daily Sueanne Margarita, MD   12.5 mg at 11/01/13 0945  . warfarin (COUMADIN) tablet 5 mg  5 mg Oral ONCE-1800 Burna Cash, RPH      . Warfarin - Pharmacist Dosing Inpatient   Does not apply q1800 Ace Gins, [facility]        Physical Exam: General appearance: alert and no distress Lungs: clear to auscultation bilaterally Heart: regular rate and rhythm, S1, S2 normal, no murmur, click, rub or gallop Extremities: extremities normal, atraumatic, no cyanosis or edema  Lab Results: Results for orders placed during the hospital encounter of 10/29/13 (from the past 48 hour(s))  PROTIME-INR     Status: Abnormal   Collection Time    10/30/13 10:42 AM      Result Value Ref Range   Prothrombin Time 30.3 (*) 11.6 - 15.2 seconds   INR 3.03 (*) 0.00 - 1.49  CBC     Status: Abnormal   Collection Time    10/30/13 10:42 AM      Result Value Ref Range   WBC 12.5 (*) 4.0 - 10.5 K/uL   RBC  4.10 (*) 4.22 - 5.81 MIL/uL   Hemoglobin 14.2  13.0 - 17.0 g/dL   HCT 40.5  39.0 - 52.0 %   MCV 98.8  78.0 - 100.0 fL   MCH 34.6 (*) 26.0 - 34.0 pg   MCHC 35.1  30.0 - 36.0 g/dL   RDW 15.2  11.5 - 15.5 %   Platelets 196  150 - 400 K/uL  GLUCOSE, CAPILLARY     Status: Abnormal   Collection Time    10/30/13 12:04 PM      Result Value Ref Range   Glucose-Capillary 143 (*) 70 - 99 mg/dL  URINALYSIS, ROUTINE W REFLEX MICROSCOPIC     Status: None   Collection Time    10/30/13 12:13 PM      Result Value Ref Range   Color, Urine YELLOW  YELLOW   APPearance CLEAR  CLEAR   Specific Gravity, Urine 1.022  1.005 - 1.030   pH 6.5  5.0 - 8.0   Glucose, UA NEGATIVE  NEGATIVE mg/dL   Hgb urine dipstick NEGATIVE  NEGATIVE   Bilirubin Urine NEGATIVE  NEGATIVE   Ketones, ur NEGATIVE  NEGATIVE mg/dL   Protein, ur NEGATIVE  NEGATIVE mg/dL   Urobilinogen, UA 1.0  0.0 - 1.0 mg/dL   Nitrite NEGATIVE   NEGATIVE   Leukocytes, UA NEGATIVE  NEGATIVE   Comment: MICROSCOPIC NOT DONE ON URINES WITH NEGATIVE PROTEIN, BLOOD, LEUKOCYTES, NITRITE, OR GLUCOSE <1000 mg/dL.  GLUCOSE, CAPILLARY     Status: Abnormal   Collection Time    10/30/13  4:42 PM      Result Value Ref Range   Glucose-Capillary 216 (*) 70 - 99 mg/dL  GLUCOSE, CAPILLARY     Status: Abnormal   Collection Time    10/30/13  9:10 PM      Result Value Ref Range   Glucose-Capillary 118 (*) 70 - 99 mg/dL  BASIC METABOLIC PANEL     Status: Abnormal   Collection Time    10/31/13  3:30 AM      Result Value Ref Range   Sodium 137  137 - 147 mEq/L   Potassium 3.9  3.7 - 5.3 mEq/L   Chloride 103  96 - 112 mEq/L   CO2 22  19 - 32 mEq/L   Glucose, Bld 105 (*) 70 - 99 mg/dL   BUN 17  6 - 23 mg/dL   Creatinine, Ser 0.93  0.50 - 1.35 mg/dL   Calcium 8.7  8.4 - 10.5 mg/dL   GFR calc non Af Amer 88 (*) >90 mL/min   GFR calc Af Amer >90  >90 mL/min   Comment: (NOTE)     The eGFR has been calculated using the CKD EPI equation.     This calculation has not been validated in all clinical situations.     eGFR's persistently <90 mL/min signify possible Chronic Kidney     Disease.  CBC     Status: Abnormal   Collection Time    10/31/13  3:30 AM      Result Value Ref Range   WBC 9.1  4.0 - 10.5 K/uL   RBC 3.36 (*) 4.22 - 5.81 MIL/uL   Hemoglobin 11.6 (*) 13.0 - 17.0 g/dL   Comment: DELTA CHECK NOTED     REPEATED TO VERIFY   HCT 33.3 (*) 39.0 - 52.0 %   MCV 99.1  78.0 - 100.0 fL   MCH 34.5 (*) 26.0 - 34.0 pg   MCHC 34.8  30.0 -  36.0 g/dL   RDW 15.0  11.5 - 15.5 %   Platelets 157  150 - 400 K/uL  PROTIME-INR     Status: Abnormal   Collection Time    10/31/13  3:30 AM      Result Value Ref Range   Prothrombin Time 29.1 (*) 11.6 - 15.2 seconds   INR 2.87 (*) 0.00 - 1.49  GLUCOSE, CAPILLARY     Status: Abnormal   Collection Time    10/31/13  7:45 AM      Result Value Ref Range   Glucose-Capillary 107 (*) 70 - 99 mg/dL  GLUCOSE,  CAPILLARY     Status: Abnormal   Collection Time    10/31/13 10:58 AM      Result Value Ref Range   Glucose-Capillary 192 (*) 70 - 99 mg/dL   Comment 1 Notify RN    GLUCOSE, CAPILLARY     Status: Abnormal   Collection Time    10/31/13  3:55 PM      Result Value Ref Range   Glucose-Capillary 146 (*) 70 - 99 mg/dL   Comment 1 Notify RN    GLUCOSE, CAPILLARY     Status: Abnormal   Collection Time    10/31/13  9:33 PM      Result Value Ref Range   Glucose-Capillary 114 (*) 70 - 99 mg/dL   Comment 1 Notify RN     Comment 2 Documented in Chart    BASIC METABOLIC PANEL     Status: Abnormal   Collection Time    11/01/13  3:50 AM      Result Value Ref Range   Sodium 138  137 - 147 mEq/L   Potassium 3.5 (*) 3.7 - 5.3 mEq/L   Chloride 98  96 - 112 mEq/L   CO2 25  19 - 32 mEq/L   Glucose, Bld 112 (*) 70 - 99 mg/dL   BUN 16  6 - 23 mg/dL   Creatinine, Ser 0.97  0.50 - 1.35 mg/dL   Calcium 9.0  8.4 - 10.5 mg/dL   GFR calc non Af Amer 86 (*) >90 mL/min   GFR calc Af Amer >90  >90 mL/min   Comment: (NOTE)     The eGFR has been calculated using the CKD EPI equation.     This calculation has not been validated in all clinical situations.     eGFR's persistently <90 mL/min signify possible Chronic Kidney     Disease.  CBC     Status: Abnormal   Collection Time    11/01/13  3:50 AM      Result Value Ref Range   WBC 9.3  4.0 - 10.5 K/uL   RBC 3.54 (*) 4.22 - 5.81 MIL/uL   Hemoglobin 12.2 (*) 13.0 - 17.0 g/dL   HCT 34.7 (*) 39.0 - 52.0 %   MCV 98.0  78.0 - 100.0 fL   MCH 34.5 (*) 26.0 - 34.0 pg   MCHC 35.2  30.0 - 36.0 g/dL   RDW 14.9  11.5 - 15.5 %   Platelets 165  150 - 400 K/uL  PROTIME-INR     Status: Abnormal   Collection Time    11/01/13  3:50 AM      Result Value Ref Range   Prothrombin Time 26.5 (*) 11.6 - 15.2 seconds   INR 2.54 (*) 0.00 - 1.49  GLUCOSE, CAPILLARY     Status: Abnormal   Collection Time    11/01/13  6:21 AM  Result Value Ref Range    Glucose-Capillary 107 (*) 70 - 99 mg/dL   Comment 1 Notify RN     Comment 2 Documented in Chart      Imaging: No results found.  Assessment:  1. Active Problems: 2.   Respiratory failure 3.   Acute respiratory failure 4.   Acute pulmonary edema 5.   NSTEMI (non-ST elevated myocardial infarction) 6.   CAD (coronary artery disease), native coronary artery 7.   Dyslipidemia 8.   Ischemic cardiomyopathy 9.   PAF (paroxysmal atrial fibrillation) 10.   Diabetes 11.   Ventricular tachycardia 12.   Chronic anticoagulation 13.   AICD (automatic cardioverter/defibrillator) present 14.   AICD discharge 15.   Paroxysmal atrial fibrillation 16.   Plan:  1. Ok to d/c home today on current medications and doses. He will follow-up with cardiologist in [facility] on Monday. Will need short supply of lasix to go home with if possible. Please make sure his Rx's for amiodarone go to his pharmacy in [facility].  Time Spent Directly with Patient:  15 minutes  Length of Stay:  LOS: 3 days   Pixie Casino, MD, [facility] Attending Cardiologist [facility]  Citlalli Weikel C 11/01/2013, 10:38 AM

## 2013-11-01 NOTE — Progress Notes (Signed)
ANTICOAGULATION CONSULT NOTE - Follow-Up Consult  Pharmacy Consult for Warfarin Indication: atrial fibrillation  Allergies  Allergen Reactions  . Captopril   . Heparin     Patient reported. Unknown reaction. However patient reported that he was diagnosed with this after his CABG  . Ibuprofen   . Latex     Patient Measurements: Height: 6\' 1"  (185.4 cm) Weight: 224 lb 4.8 oz (101.742 kg) (scale C) IBW/kg (Calculated) : 79.9  Vital Signs: Temp: 98.4 F (36.9 C) (03/15 0125) Temp src: Oral (03/15 0125) BP: 101/71 mmHg (03/15 0125) Pulse Rate: 88 (03/15 0125)  Labs:  Recent Labs  10/29/13 2215 10/30/13 0418 10/30/13 0610 10/30/13 0855 10/30/13 1042 10/31/13 0330 11/01/13 0350  HGB 15.8  --   --   --  14.2 11.6* 12.2*  HCT 44.7  --   --   --  40.5 33.3* 34.7*  PLT 220  --   --   --  196 157 165  APTT 35  --   --   --   --   --   --   LABPROT 28.9*  --   --   --  30.3* 29.1* 26.5*  INR 2.85*  --   --   --  3.03* 2.87* 2.54*  CREATININE 1.11  --  1.01  --   --  0.93 0.97  TROPONINI  --  1.13*  --  1.06*  --   --   --     Estimated Creatinine Clearance: 97.7 ml/min (by C-G formula based on Cr of 0.97).   Medical History: Past Medical History  Diagnosis Date  . A-fib   . CAD (coronary artery disease)     s/ p CABG x 5, 1 stent in 2008.  Marland Kitchen HLD (hyperlipidemia)   . Diabetes mellitus type 2 in obese   . Systolic CHF     Last LVEF 25% per patient  . ICD (implantable cardioverter-defibrillator) in place 2004    [employer]  . Ventricular tachycardia (paroxysmal)     hx of ICD shocks x 4 per patient    Medications:  Scheduled:  . amiodarone  200 mg Oral Daily  . aspirin EC  81 mg Oral Daily  . atorvastatin  40 mg Oral q1800  . furosemide  20 mg Oral Daily  . insulin aspart  0-15 Units Subcutaneous TID WC  . insulin aspart  0-5 Units Subcutaneous QHS  . metFORMIN  250 mg Oral BID WC  . metoprolol succinate  12.5 mg Oral Daily  . sodium chloride  3 mL  Intravenous Q12H  . spironolactone  12.5 mg Oral Daily  . Warfarin - Pharmacist Dosing Inpatient   Does not apply q1800    Assessment: 63 yo male admitted on 3/12 with CC of defibrillator shock and SOB. Pharmacy has been consulted to resume PTA warfarin for afib. INR on admit on 3/12 was 2.85, and INR on 3/13 was 3.03. Today's INR 2.54. Patient started on amiodarone on 3/13, will likely need a dose reduction over next several weeks.  Hg 12.2, platelets 165. No s/s of bleeding per nurse.   Warfarin PTA dose: 5mg  daily  Goal of Therapy:  INR 2-3 Monitor platelets by anticoagulation protocol: Yes   Plan:  - Warfarin 5mg  x 1 tonight  - Monitor daily CBC, INR, and s/s bleeding  Fotini Lemus B. Artelia Laroche, PharmD Clinical Pharmacist - Resident Phone: [phone] Pager: [phone] 11/01/2013 9:17 AM

## 2013-11-01 NOTE — Discharge Summary (Signed)
Physician Discharge Summary  Patient ID: Jose Sanford MRN: [id number] DOB/AGE: August 01, 1951 63 y.o.  Admit date: 10/29/2013 Discharge date: 11/01/2013  Primary Discharge Diagnosis: 1. Acute on Chronic Respiratory Failure 2. Acute Pulmonary Edema 3. NSTEMI 4. Atrial Fibrillation  Secondary Discharge Diagnosis: 1.Coronary Artery Disease 2. Ischemic CM 3. Diabetes 4.AICD insitu  Significant Diagnostic Studies:  1.Echocardiogram 10/30/2013 Left ventricle: There is diffuse hypokinesis. But the base/mid inferolateral segments move better than other areas. The is strong suggestion of clot in the LV apex. I can not find a study for comparison. The cavity size was mildly dilated. The estimated ejection fraction was 30%. Findings consistent with left ventricular diastolic dysfunction. - Mitral valve: Mildly calcified annulus. Mild regurgitation. - Left atrium: The atrium was moderately dilated. - Right ventricle: The cavity size was mildly dilated. Systolic function was mildly to moderately reduced. - Right atrium: The atrium was mildly dilated. - Pulmonary arteries: PA peak pressure: 75mm Hg (S).   Consults:  Electrophysiology: Dr. Gertie Sanford Course:  [name] is a 63 y/o patient followed by Dr. Clarita Sanford at [facility], [place], [place], with known history of CAD, s/P CABG, ICM, s/p Dual chamber ICD implant, systolic dysfunction, and atrial fibrillation on anticoagulation with warfarin. He was visiting [facility] for [facility] tournament. He had sudden onset of shortness of breath and experience ICD shock. He was admitted with palpitations and recurrent ICD shocks.. He had symptoms of severe shortness of breath. He was found to be hypoxic with RA O2 sat of 88%.He was placed on BiPAP.  A STEMI because of the left bundle branch block on the electrocardiogram. On arrival to the ED patient denied chest pain however was in severe respiratory distress and severe respiratory  failure requiring noninvasive positive pressure ventilation. He was given 4 mg of morphine, started on nitroglycerin drip and continued on BiPAP with significant prominence of the into shortness of breath. This was cancelled. He was positive for NSTEMI with troponin of 1.13.   He had echocardiogram completed demonstrating EF of 30%, with diffuse hypokinesis of the LV.There was a stsrong suggestin of a clot in the LV apex. He was also found to have pulmonary edema on admission. He admitted to dietary non-compliance. He was treated with IV lasix and diuresed well. He was subsequently placed on po lasix 20 mg daily. He was diuresed 16 lbs, with discharge wt of 224 lb and admission wt of 240 lbs. .   EP consultation was completed on 10/30/2013. Note documented that he is seen by Dr. Maudry Sanford at [facility]. A call was made to him by Dr. Johney Sanford. It was Dr. Golden Sanford recommendation that the patient be started on amiodarone 200 mg daily without initial load.  He was to remain on coumadin.   He was seen and examined by Dr. [place] Sanford and found to be stable for discharge. He will follow up with his PCP and with his cardiologist in [facility]. He is to continue to see EP as well. He was provided with written Rx for amiodarone,and lasix. Copies of his hospital records were printed to give to patient for documentation for cardiology follow up appointments in [facility].  Discharge Exam: Blood pressure 112/79, pulse 98, temperature 98.4 F (36.9 C), temperature source Oral, resp. rate 18, height 6\' 1"  (1.854 m), weight 224 lb 4.8 oz (101.742 kg), SpO2 98.00%.  Labs:   Lab Results  Component Value Date   WBC 9.3 11/01/2013   HGB 12.2* 11/01/2013  HCT 34.7* 11/01/2013   MCV 98.0 11/01/2013   PLT 165 11/01/2013     Recent Labs Lab 11/01/13 0350  NA 138  K 3.5*  CL 98  CO2 25  BUN 16  CREATININE 0.97  CALCIUM 9.0  GLUCOSE 112*   Lab Results  Component Value Date   TROPONINI 1.06* 10/30/2013          Radiology: Dg Chest 2 View  10/30/2013   CLINICAL DATA:  CHF, weakness.  EXAM: CHEST  2 VIEW  COMPARISON:  DG CHEST 1V PORT dated 10/29/2013  FINDINGS: Trachea is midline. Heart is enlarged. Pacemaker and ICD lead tips are seen in the right atrium and right ventricle, respectively. Biapical pleural thickening. Near complete resolution of previously seen pulmonary edema. Residual peripheral septal thickening at the lung bases. Small bilateral effusions persist.  IMPRESSION: Near-complete resolution of pulmonary edema. Tiny bilateral pleural effusions.   Electronically Signed   By: Jose BattlesMelinda  Sanford M.D.   On: 10/30/2013 10:08   Dg Chest Port 1 View  10/29/2013   CLINICAL DATA:  Chest pain, shortness of breath, COPD.  EXAM: PORTABLE CHEST - 1 VIEW  COMPARISON:  None.  FINDINGS: Cardiac contour upper normal to mildly enlarged. Mild central vascular congestion. Mediastinal contours otherwise within normal range. Interstitial prominence and mild perihilar hazy airspace opacity. Mild lung base opacities. Small effusions not excluded. No pneumothorax. Left chest wall battery pack with lead tips projecting over the expected position of the right atrium and right ventricle. Multiple other wires are presumably external. Status post median sternotomy. No acute osseous finding.  IMPRESSION: Heart size upper normal to mildly enlarged. Central vascular congestion. Interstitial prominence and hazy right greater than left perihilar airspace opacity may reflect pulmonary edema or pneumonia.  Mild lung base opacities, favor atelectasis.   Electronically Signed   By: Jearld LeschAndrew  Sanford M.D.   On: 10/29/2013 23:21    FOLLOW UP PLANS AND APPOINTMENTS     Discharge Orders   Future Orders Complete By Expires   Diet - low sodium heart healthy  As directed    Increase activity slowly  As directed        Medication List         amiodarone 200 MG tablet  Commonly known as:  PACERONE  Take 1 tablet (200 mg total) by mouth  daily.     aspirin 81 MG tablet  Take 81 mg by mouth daily.     b complex vitamins tablet  Take 1 tablet by mouth daily.     Choline Fenofibrate 135 MG capsule  Take 135 mg by mouth daily.     diphenhydrAMINE 25 mg capsule  Commonly known as:  BENADRYL  Take 25 mg by mouth every 6 (six) hours as needed for allergies.     fluticasone 50 MCG/ACT nasal spray  Commonly known as:  FLONASE  Place 1-2 sprays into both nostrils daily as needed for allergies or rhinitis.     furosemide 20 MG tablet  Commonly known as:  LASIX  Take 1 tablet (20 mg total) by mouth daily.     loratadine 10 MG tablet  Commonly known as:  CLARITIN  Take 10 mg by mouth daily as needed for allergies or rhinitis.     losartan 25 MG tablet  Commonly known as:  COZAAR  Take 25 mg by mouth daily.     metFORMIN 500 MG tablet  Commonly known as:  GLUCOPHAGE  Take 250 mg by mouth 2 (two)  times daily with a meal.     metoprolol succinate 12.5 mg Tb24 24 hr tablet  Commonly known as:  TOPROL-XL  Take 0.5 tablets (12.5 mg total) by mouth daily.     niacin 1000 MG CR tablet  Commonly known as:  NIASPAN  Take 2,000 mg by mouth at bedtime.     omega-3 acid ethyl esters 1 G capsule  Commonly known as:  LOVAZA  Take 2 g by mouth daily.     pravastatin 80 MG tablet  Commonly known as:  PRAVACHOL  Take 80 mg by mouth daily.     spironolactone 12.5 mg Tabs tablet  Commonly known as:  ALDACTONE  Take 0.5 tablets (12.5 mg total) by mouth daily.     terbinafine 250 MG tablet  Commonly known as:  LAMISIL  Take 250 mg by mouth daily.     warfarin 5 MG tablet  Commonly known as:  COUMADIN  Take 5 mg by mouth daily.     XYZAL PO  Take 1 tablet by mouth daily as needed (allergies).       Follow-up Information   Please follow up. (Follow up with Dr.Lale and Dr. Davene Costain in [facility].)         Time spent with patient to include physician time: 40 minutes  Signed: Joni Reining 11/01/2013, 11:37  AM Co-Sign MD

## 2013-11-02 NOTE — Care Management Note (Addendum)
  Page 1 of 1   11/02/2013     10:50:01 AM   CARE MANAGEMENT NOTE 11/02/2013  Patient:  [facility]   Account Number:  [id number]  Date Initiated:  10/30/2013  Documentation initiated by:  Avie Arenas  Subjective/Objective Assessment:   Sudden onset of SOB pacemaker firing at [facility] tournaments     Action/Plan:   Anticipated DC Date:  11/03/2013   Anticipated DC Plan:  HOME/SELF CARE      DC Planning Services  CM consult      Choice offered to / List presented to:             Status of service:  Completed, signed off Medicare Important Message given?   (If response is "NO", the following Medicare IM given date fields will be blank) Date Medicare IM given:   Date Additional Medicare IM given:    Discharge Disposition:  HOME/SELF CARE  Per UR Regulation:  Reviewed for med. necessity/level of care/duration of stay  If discussed at Long Length of Stay Meetings, dates discussed:    Comments:  Social:  Hx/o  From [employer] [facility] for [facility] tournaments. Independent prior. Contact:  Riyaan, Waldner Spouse [phone] Dispositon:  d/c to home 11/01/2013 / self care. Erron Wengert RN, BSN, MSHL, CCM 11/02/2013  Contact:  Severus, Aguiar Spouse [phone]  10-30-13 11am Avie Arenas, RNBSN - [phone] From [facility] for [facility] tournaments. Independent prior.

## 2013-11-05 LAB — CULTURE, BLOOD (ROUTINE X 2)
CULTURE: NO GROWTH
Culture: NO GROWTH

## 2014-04-27 ENCOUNTER — Other Ambulatory Visit: Payer: Self-pay | Admitting: Adult Health

## 2015-05-09 IMAGING — CR DG CHEST 1V PORT
2 series · 2 of 2 positions shown · non-contrast
Comparison: None.

CLINICAL DATA: Chest pain, shortness of breath, COPD.

EXAM:
PORTABLE CHEST - 1 VIEW

[AP (1 of 2)]
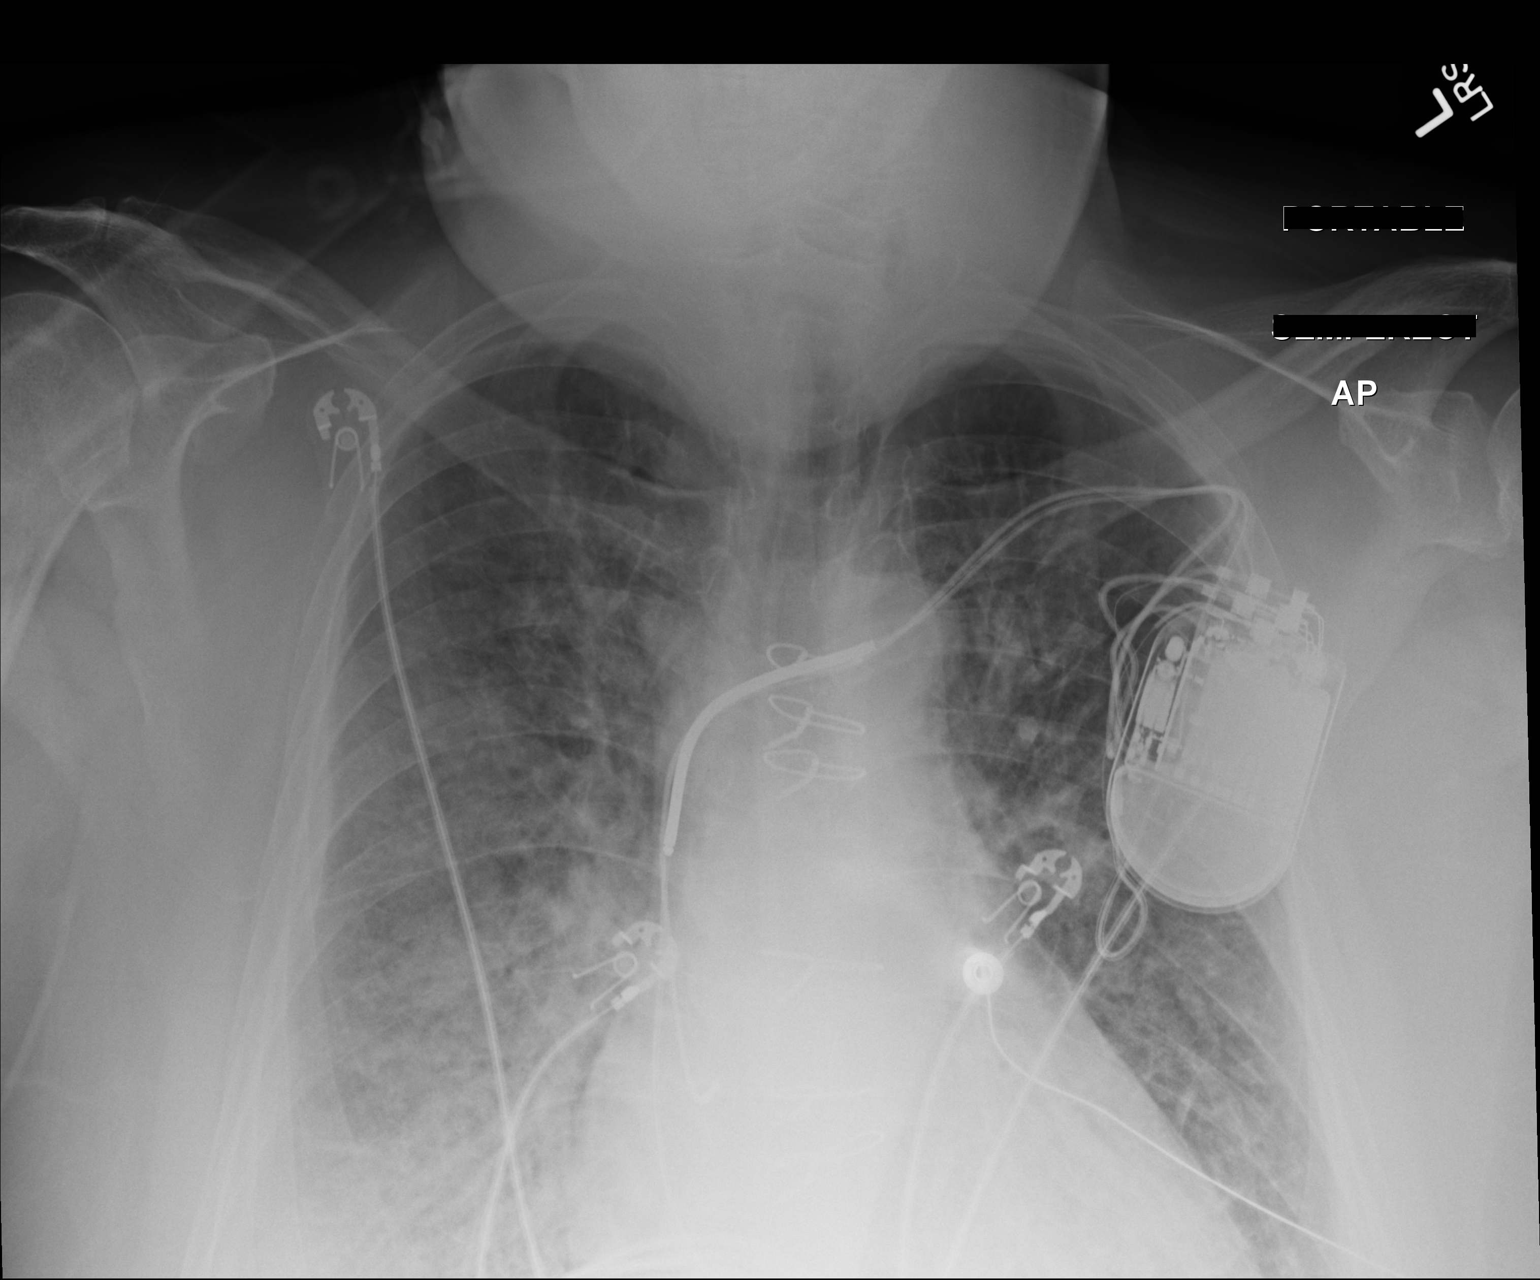

[AP (2 of 2)]
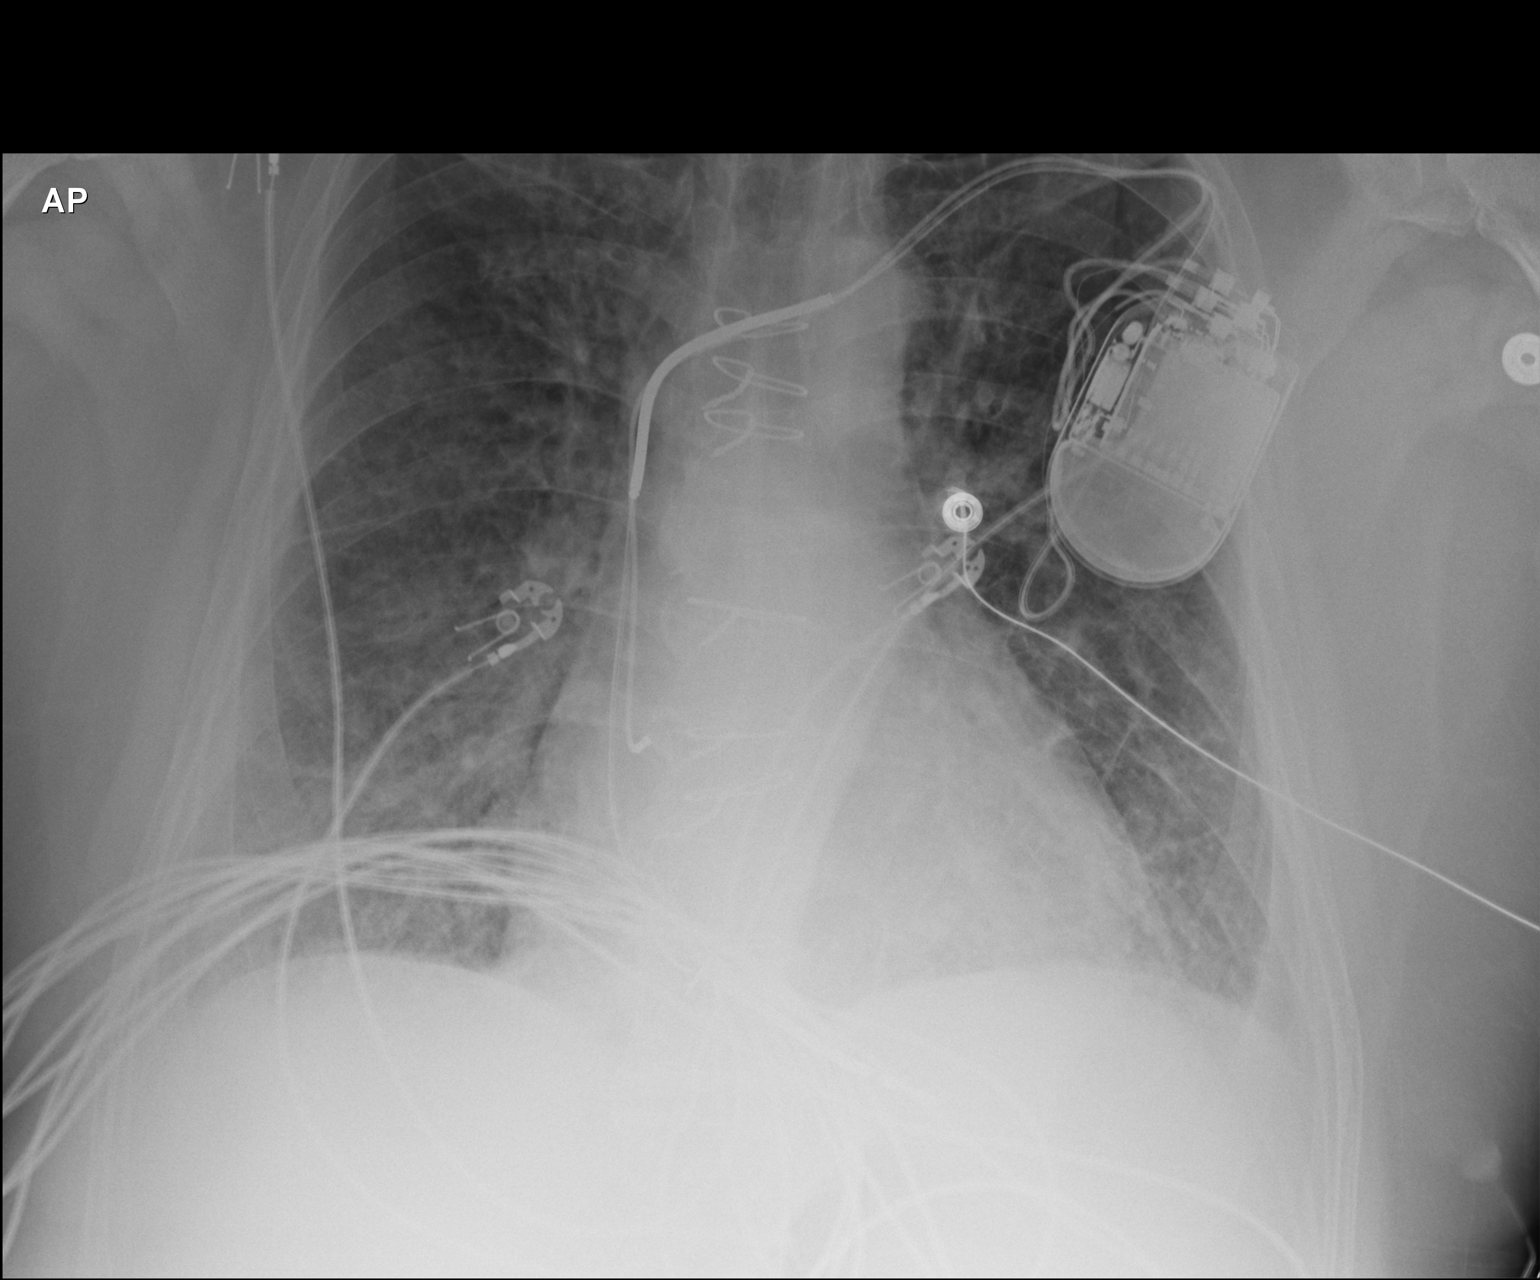

[2 of 2 positions shown; findings below may reference images not displayed]

FINDINGS: Cardiac contour upper normal to mildly enlarged. Mild central
vascular congestion. Mediastinal contours otherwise within normal
range. Interstitial prominence and mild perihilar hazy airspace
opacity. Mild lung base opacities. Small effusions not excluded. No
pneumothorax. Left chest wall battery pack with lead tips projecting
over the expected position of the right atrium and right ventricle.
Multiple other wires are presumably external. Status post median
sternotomy. No acute osseous finding.
IMPRESSION: Heart size upper normal to mildly enlarged. Central vascular
congestion. Interstitial prominence and hazy right greater than left
perihilar airspace opacity may reflect pulmonary edema or pneumonia.

Mild lung base opacities, favor atelectasis.

## 2015-05-10 IMAGING — CR DG CHEST 2V
2 series · 2 of 2 positions shown · non-contrast
Comparison: DG CHEST 1V PORT dated 10/29/2013

CLINICAL DATA: CHF, weakness.

EXAM:
CHEST  2 VIEW

[w chest lat (1 of 2)]
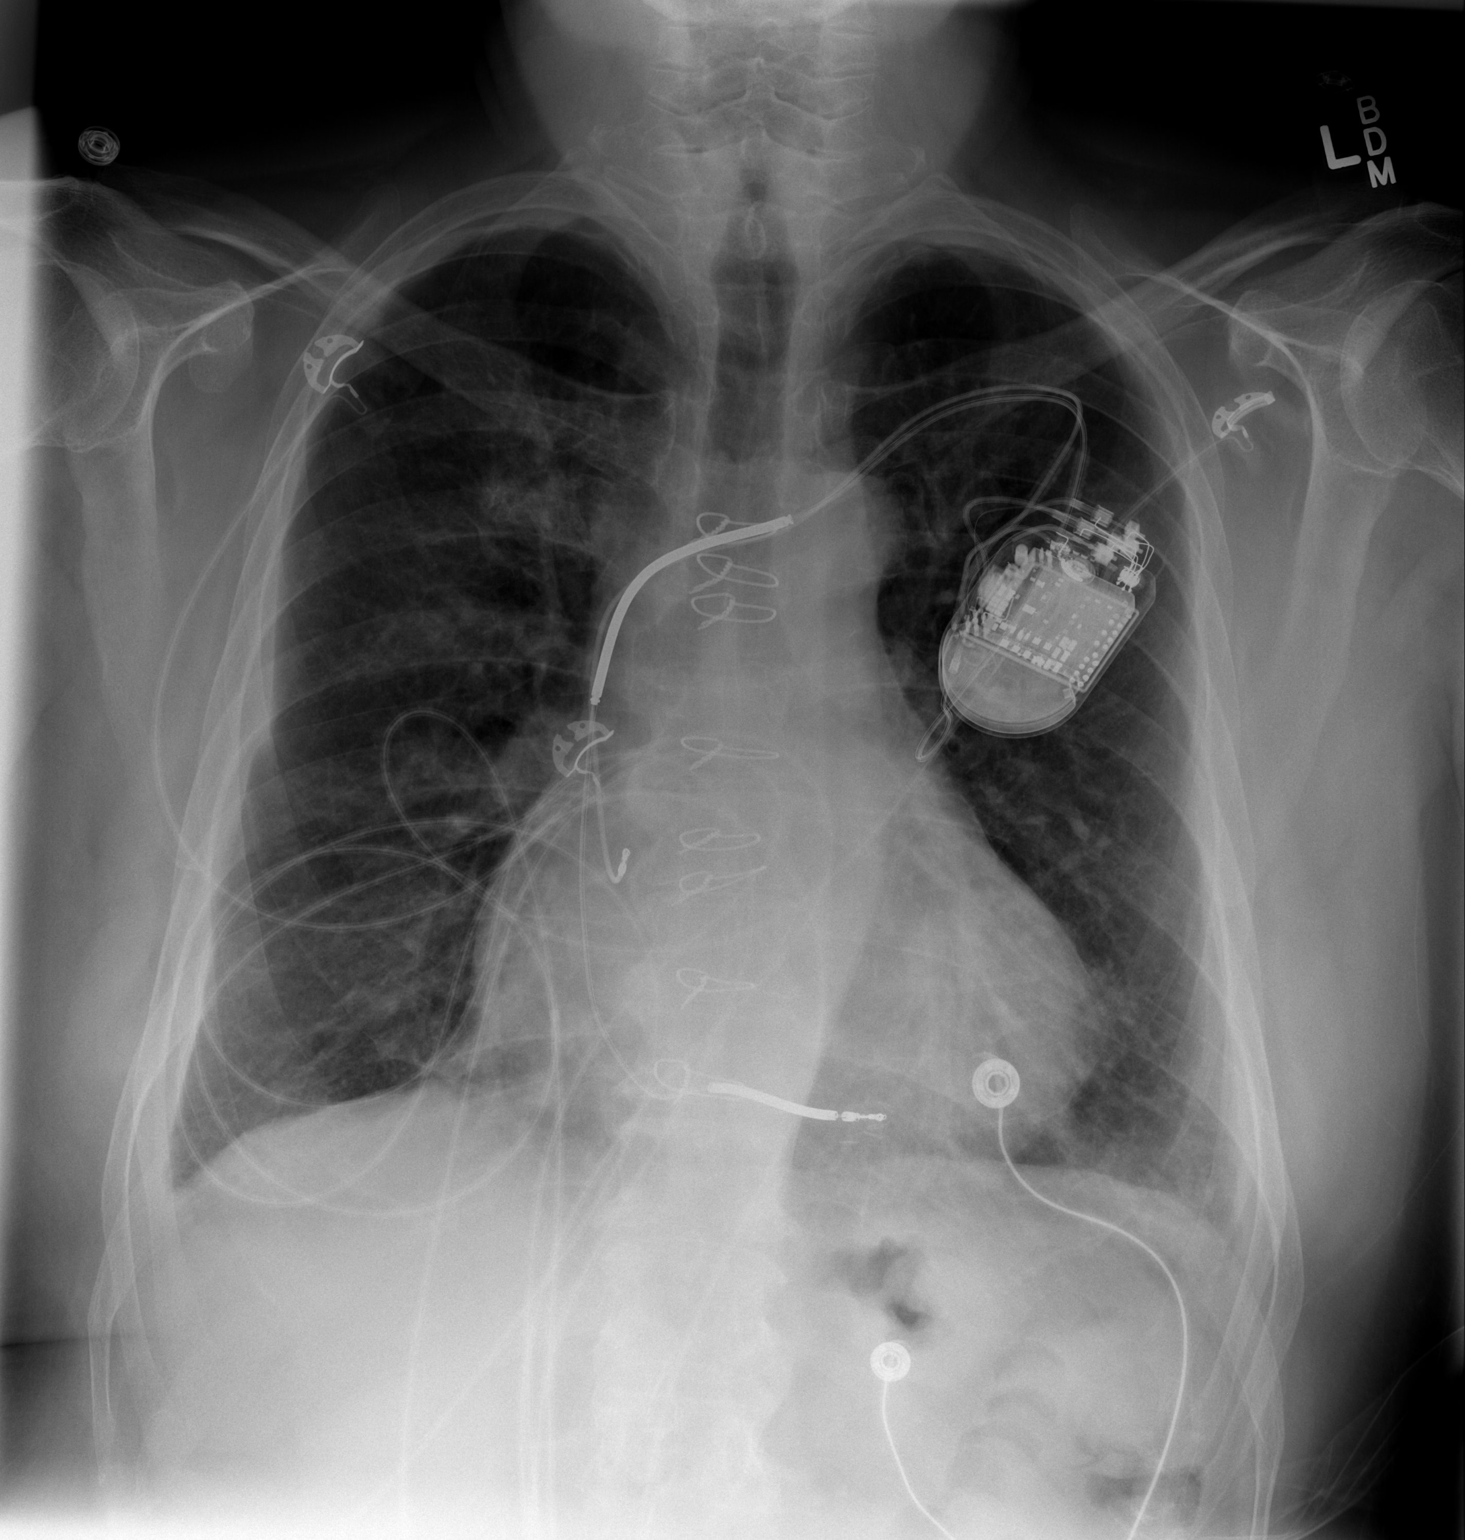

[w chest lat (2 of 2)]
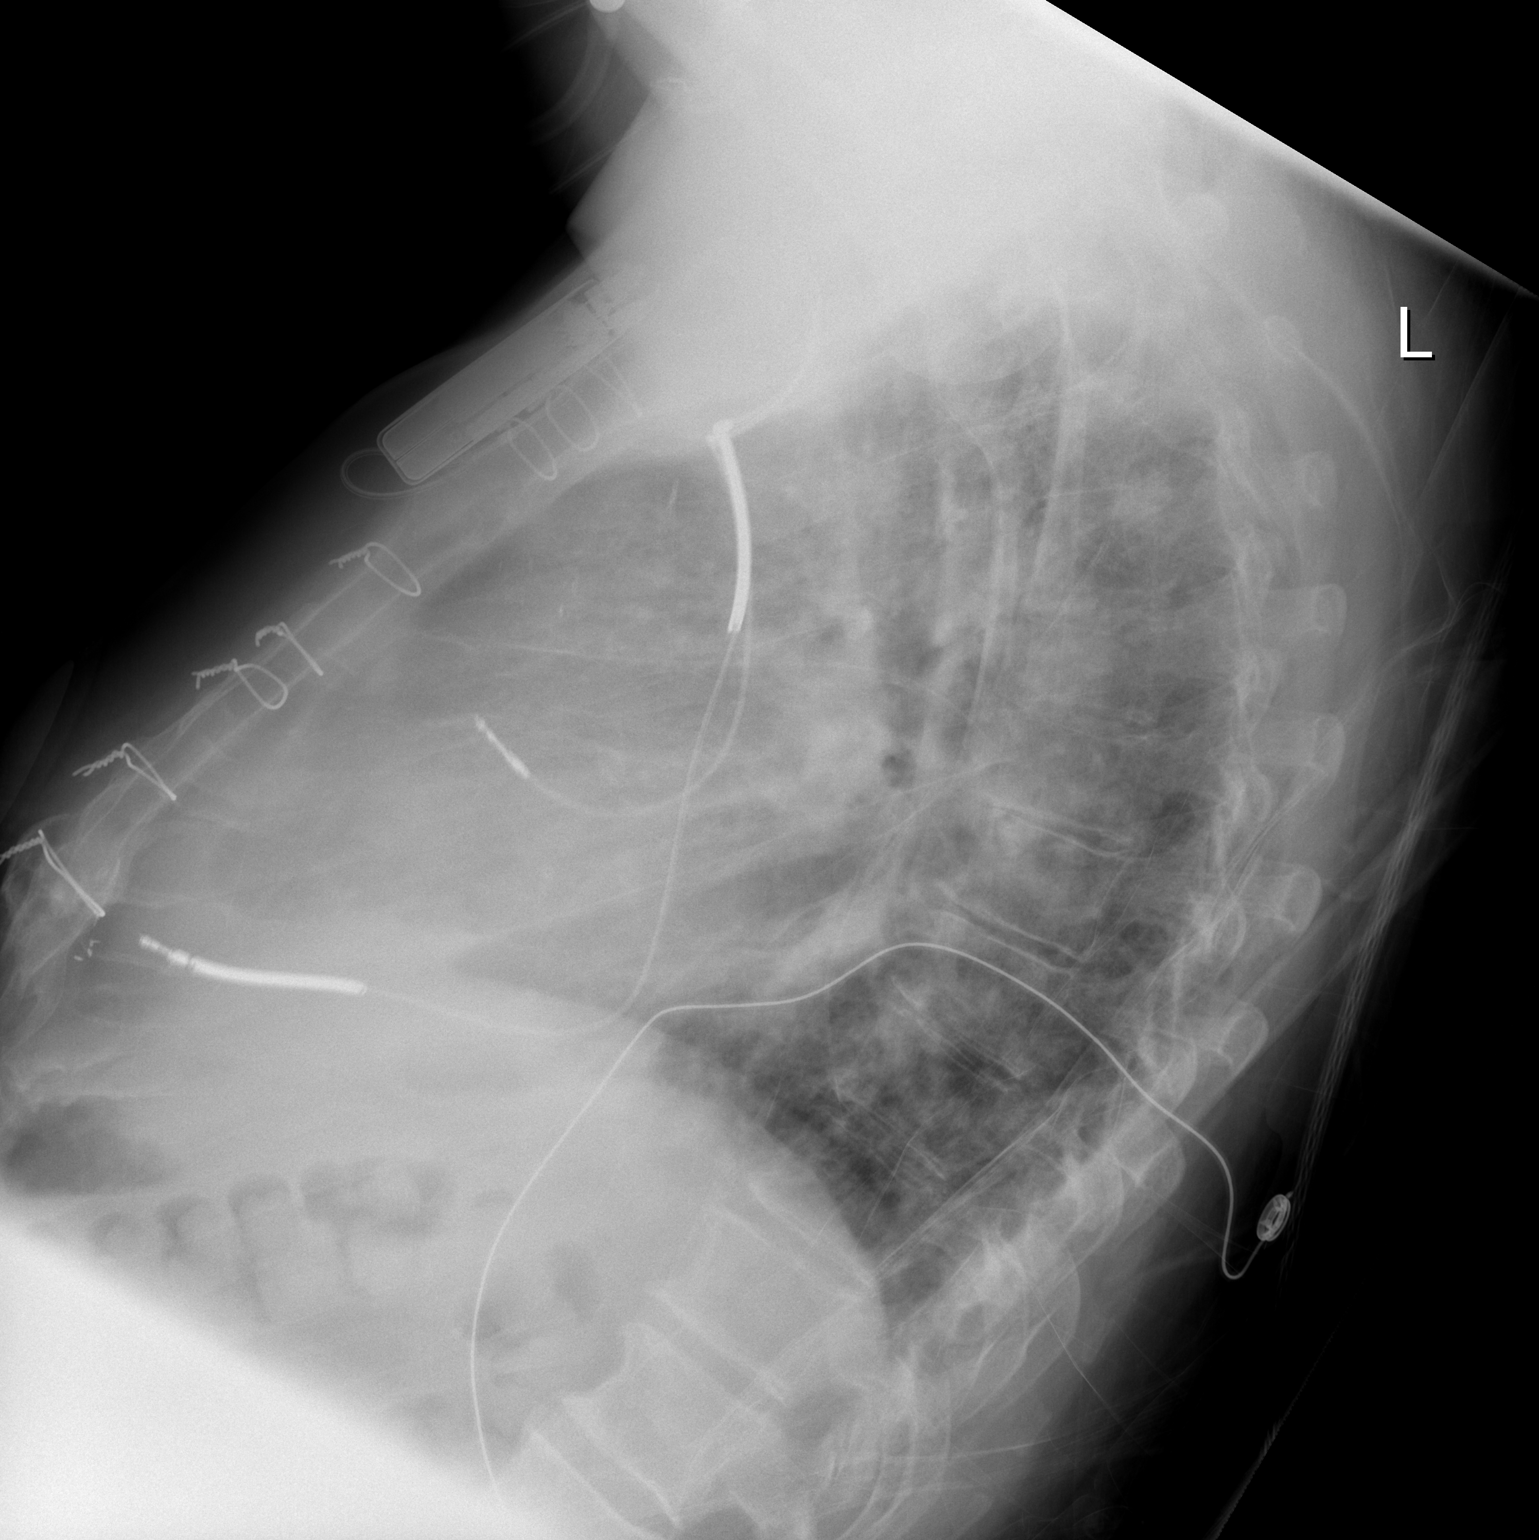

[2 of 2 positions shown; findings below may reference images not displayed]

FINDINGS: Trachea is midline. Heart is enlarged. Pacemaker and ICD lead tips
are seen in the right atrium and right ventricle, respectively.
Biapical pleural thickening. Near complete resolution of previously
seen pulmonary edema. Residual peripheral septal thickening at the
lung bases. Small bilateral effusions persist.
IMPRESSION: Near-complete resolution of pulmonary edema. Tiny bilateral pleural
effusions.

## 2024-06-20 DEATH — deceased
